# Patient Record
Sex: Male | Born: 1945 | Race: White | Hispanic: No | Marital: Married | Smoking: Former smoker
Health system: Southern US, Community
[De-identification: ages and names within clinical notes are randomized; demographics above are authoritative.]

## PROBLEM LIST (undated history)

## (undated) DIAGNOSIS — R29898 Other symptoms and signs involving the musculoskeletal system: Secondary | ICD-10-CM

## (undated) DIAGNOSIS — G20A1 Parkinson's disease without dyskinesia, without mention of fluctuations: Secondary | ICD-10-CM

## (undated) DIAGNOSIS — G20 Parkinson's disease: Secondary | ICD-10-CM

## (undated) HISTORY — PX: TOTAL HIP ARTHROPLASTY: SHX124

---

## 1993-04-02 HISTORY — PX: DEEP BRAIN STIMULATOR PLACEMENT: SHX608

## 2005-02-19 ENCOUNTER — Ambulatory Visit: Payer: Self-pay | Admitting: Cardiology

## 2006-12-25 ENCOUNTER — Ambulatory Visit (HOSPITAL_COMMUNITY): Admission: RE | Admit: 2006-12-25 | Discharge: 2006-12-25 | Payer: Self-pay | Admitting: Neurosurgery

## 2010-08-29 ENCOUNTER — Encounter (HOSPITAL_COMMUNITY)
Admission: RE | Admit: 2010-08-29 | Discharge: 2010-08-29 | Disposition: A | Discharge status: Home / Self Care | Payer: Medicare HMO | Source: Ambulatory Visit | Attending: Neurosurgery | Admitting: Neurosurgery

## 2010-08-29 LAB — CBC
HCT: 46.6 % (ref 39.0–52.0)
MCV: 88.3 fL (ref 78.0–100.0)
Platelets: 131 10*3/uL — ABNORMAL LOW (ref 150–400)
RBC: 5.28 MIL/uL (ref 4.22–5.81)
RDW: 13.5 % (ref 11.5–15.5)
WBC: 6.5 10*3/uL (ref 4.0–10.5)

## 2010-08-29 LAB — BASIC METABOLIC PANEL
BUN: 18 mg/dL (ref 6–23)
Creatinine, Ser: 0.95 mg/dL (ref 0.4–1.5)
GFR calc Af Amer: >60 mL/min (ref >60)
GFR calc non Af Amer: >60 mL/min (ref >60)

## 2010-08-29 LAB — SURGICAL PCR SCREEN
MRSA, PCR: NEGATIVE
Staphylococcus aureus: NEGATIVE

## 2010-08-29 LAB — PROTIME-INR: INR: 1.24 (ref 0.00–1.49)

## 2010-08-29 LAB — APTT: aPTT: 37 seconds (ref 24–37)

## 2010-08-31 ENCOUNTER — Ambulatory Visit (HOSPITAL_COMMUNITY)
Admission: RE | Admit: 2010-08-31 | Discharge: 2010-08-31 | Disposition: A | Discharge status: Home / Self Care | Payer: Medicare HMO | Source: Ambulatory Visit | Attending: Neurosurgery | Admitting: Neurosurgery

## 2010-08-31 DIAGNOSIS — Z462 Encounter for fitting and adjustment of other devices related to nervous system and special senses: Secondary | ICD-10-CM | POA: Insufficient documentation

## 2010-08-31 DIAGNOSIS — Z01812 Encounter for preprocedural laboratory examination: Secondary | ICD-10-CM | POA: Insufficient documentation

## 2010-08-31 DIAGNOSIS — G20 Parkinson's disease: Secondary | ICD-10-CM | POA: Insufficient documentation

## 2010-08-31 DIAGNOSIS — Z01810 Encounter for preprocedural cardiovascular examination: Secondary | ICD-10-CM | POA: Insufficient documentation

## 2010-08-31 DIAGNOSIS — E669 Obesity, unspecified: Secondary | ICD-10-CM | POA: Insufficient documentation

## 2010-08-31 DIAGNOSIS — G20A1 Parkinson's disease without dyskinesia, without mention of fluctuations: Secondary | ICD-10-CM | POA: Insufficient documentation

## 2010-09-05 NOTE — Op Note (Signed)
  NAMEJOSHIA, Johnny Wells                 ACCOUNT NO.:  [id number]  MEDICAL RECORD NO.:  [id number]           PATIENT TYPE:  O  LOCATION:  SDSC                         FACILITY:  [facility]  PHYSICIAN:  Danae Orleans. Venetia Maxon, M.D.  DATE OF BIRTH:  07-30-1945  DATE OF PROCEDURE:  08/31/2010 DATE OF DISCHARGE:                              OPERATIVE REPORT   PREOPERATIVE DIAGNOSIS:  Depleted implantable pulse generator with deep brain stimulator for Parkinson disease.  POSTOPERATIVE DIAGNOSIS:  Depleted implantable pulse generator with deep brain stimulator for Parkinson disease.  PROCEDURE:  Replacement of implantable pulse generator.  SURGEON:  Danae Orleans. Venetia Maxon, MD  ASSISTANT:  Georgiann Cocker, RN  ANESTHESIA:  Monitored sedation with local lidocaine.  COMPLICATIONS:  None.  ESTIMATED BLOOD LOSS:  Minimal.  DISPOSITION:  Recovery.  INDICATION:  Johnny Wells is a 65 year old man with Parkinson's who has previously had a deep brain stimulator placed and has a depleted implantable pulse generator.  He is having more trouble with dyskinesias and it was elected to place to revise the battery.  PROCEDURE:  Mr. Buresh was brought to the operating room.  He was placed in a supine position on the operating table.  Left upper chest and neck were then prepped and draped in usual sterile fashion.  After Betadine scrub paint, area of planned incision was infiltrated local lidocaine. Previous incision was opened.  The old battery was identified along the extensions, it was then brought out the pocket.  The extensions were disconnected and a pocket adapter was then placed.  A new DBS battery was then hooked up to the 6 lead extension placed within, all the wires were circularized behind the battery and was placed in the pocket.  The wound was then closed with 2-0 and 3-0 Vicryl sutures and sterile occlusive dressing was placed.  The patient was taken to recovery room having tolerated the procedure  well.     Danae Orleans. Venetia Maxon, M.D.     JDS/MEDQ  D:  08/31/2010  T:  09/01/2010  Job:  [id number]  Electronically Signed by Maeola Harman M.D. on 09/05/2010 11:14:31 AM

## 2010-12-08 ENCOUNTER — Inpatient Hospital Stay (HOSPITAL_COMMUNITY)
Admission: AD | Admit: 2010-12-08 | Discharge: 2010-12-09 | DRG: 042 | Disposition: A | Discharge status: Home / Self Care | Payer: Medicare HMO | Source: Ambulatory Visit | Attending: Neurosurgery | Admitting: Neurosurgery

## 2010-12-08 ENCOUNTER — Inpatient Hospital Stay: Admission: AD | Admit: 2010-12-08 | Payer: Medicare HMO | Source: Ambulatory Visit | Admitting: Neurosurgery

## 2010-12-08 DIAGNOSIS — E669 Obesity, unspecified: Secondary | ICD-10-CM | POA: Diagnosis present

## 2010-12-08 DIAGNOSIS — L8990 Pressure ulcer of unspecified site, unspecified stage: Secondary | ICD-10-CM | POA: Diagnosis present

## 2010-12-08 DIAGNOSIS — L89109 Pressure ulcer of unspecified part of back, unspecified stage: Secondary | ICD-10-CM | POA: Diagnosis present

## 2010-12-08 DIAGNOSIS — T85890A Other specified complication of nervous system prosthetic devices, implants and grafts, initial encounter: Principal | ICD-10-CM | POA: Diagnosis present

## 2010-12-08 DIAGNOSIS — Z86718 Personal history of other venous thrombosis and embolism: Secondary | ICD-10-CM

## 2010-12-08 DIAGNOSIS — L989 Disorder of the skin and subcutaneous tissue, unspecified: Secondary | ICD-10-CM | POA: Diagnosis present

## 2010-12-08 DIAGNOSIS — G20 Parkinson's disease: Secondary | ICD-10-CM | POA: Diagnosis present

## 2010-12-08 DIAGNOSIS — Y92009 Unspecified place in unspecified non-institutional (private) residence as the place of occurrence of the external cause: Secondary | ICD-10-CM

## 2010-12-08 DIAGNOSIS — G20A1 Parkinson's disease without dyskinesia, without mention of fluctuations: Secondary | ICD-10-CM | POA: Diagnosis present

## 2010-12-08 DIAGNOSIS — Y831 Surgical operation with implant of artificial internal device as the cause of abnormal reaction of the patient, or of later complication, without mention of misadventure at the time of the procedure: Secondary | ICD-10-CM | POA: Diagnosis present

## 2010-12-08 DIAGNOSIS — Z7901 Long term (current) use of anticoagulants: Secondary | ICD-10-CM

## 2010-12-08 LAB — CBC
MCHC: 36.4 g/dL — ABNORMAL HIGH (ref 30.0–36.0)
Platelets: 128 10*3/uL — ABNORMAL LOW (ref 150–400)
RDW: 13.7 % (ref 11.5–15.5)
WBC: 6.1 10*3/uL (ref 4.0–10.5)

## 2010-12-08 LAB — GRAM STAIN: Gram Stain: NONE SEEN

## 2010-12-08 LAB — DIFFERENTIAL
Basophils Absolute: 0 10*3/uL (ref 0.0–0.1)
Basophils Relative: 0 % (ref 0–1)
Eosinophils Absolute: 0.2 10*3/uL (ref 0.0–0.7)
Eosinophils Relative: 3 % (ref 0–5)
Monocytes Absolute: 0.6 10*3/uL (ref 0.1–1.0)

## 2010-12-08 LAB — BASIC METABOLIC PANEL
CO2: 28 mEq/L (ref 19–32)
Calcium: 9.8 mg/dL (ref 8.4–10.5)
Creatinine, Ser: 1.02 mg/dL (ref 0.50–1.35)
GFR calc non Af Amer: >60 mL/min (ref >60)

## 2010-12-08 LAB — SURGICAL PCR SCREEN: MRSA, PCR: NEGATIVE

## 2010-12-08 LAB — PROTIME-INR
INR: 2.38 — ABNORMAL HIGH (ref 0.00–1.49)
Prothrombin Time: 26.4 seconds — ABNORMAL HIGH (ref 11.6–15.2)

## 2010-12-11 LAB — WOUND CULTURE

## 2010-12-13 LAB — ANAEROBIC CULTURE: Gram Stain: NONE SEEN

## 2010-12-14 NOTE — H&P (Signed)
  NAMECASPAR, FAVILA                 ACCOUNT NO.:  [id number]  MEDICAL RECORD NO.:  [id number]  LOCATION:  3008                         FACILITY:  [facility]  PHYSICIAN:  Danae Orleans. Venetia Maxon, M.D.  DATE OF BIRTH:  [birth year]  DATE OF ADMISSION:  12/08/2010 DATE OF DISCHARGE:                             HISTORY & PHYSICAL   REASON FOR ADMISSION:  Skin erosion over implantable pulse generator for deep brain stimulator for Parkinson disease.  HISTORY OF PRESENT ILLNESS:  Johnny Wells is a 65 year old male, admitted today for removal implantable pulse generator for deep brain stimulator due to erosion through the skin.  He has been afebrile without complaints of pain.  He has a history of Parkinson and takes oral medications.  He is awaiting DBS programming.  He has a history of deep venous thrombosis for which he has been on Coumadin.  He has no known drug allergies.  He is 6 feet 3 inches tall, 290 pounds.  Does not use tobacco or alcohol.  No history of drug abuse.  PHYSICAL EXAMINATION:  HEENT:  The patient is normocephalic, atraumatic. Pupils are equal, round, and reactive to light.  Extraocular movements are intact.  Oropharynx is benign.  Usual is midline. NECK:  No tracheal deviation.  JVD, bruits auscultated.  He has good range of motion of his neck.  He has significant dyskinesias related to his Parkinson.  Deep brain stimulator extensions were palpable in left side of his neck which is nontender. RESPIRATORY:  Normal effort.  Clear to auscultation in all lobes. HEART:  Regular rhythm without murmurs.  No peripheral edema. ABDOMEN:  Obese, nontender.  Bowel sounds are active x4. NEUROLOGICAL:  Unable to ambulate any distances due to dyskinesia secondary to Parkinson's. SKIN:  He has a small sacral decubitus for which he is receiving daily dressing changes.  CURRENT MEDICATIONS: 1. Carbidopa 25/100 one 4 times daily. 2. Comtan 200 mg 4 times daily. 3. Detrol 4 mg daily. 4.  Paxil 20 mg daily. 5. Coumadin 7.5 mg daily afternoon, last dose on December 07, 2010.  SURGICAL HISTORY:  History of deep brain stimulation in January 2005 and DBS implantable pulse generator revision in May 2012.  VITALS:  Blood pressure 104/70, heart rate of 80 and regular, respiratory rate 18.  He is afebrile.  PLAN:  To admit the patient to 3000 for removal of deep brain stimulator, implantable pulse generator.  Told Coumadin to continue with home medications otherwise.  He is to start with IV fluids and the preoperative laboratories and he is made n.p.o. for pending surgery.     Danae Orleans. Venetia Maxon, M.D.     JDS/MEDQ  D:  12/08/2010  T:  12/09/2010  Job:  [id number]  Electronically Signed by Maeola Harman M.D. on 12/14/2010 02:48:55 PM

## 2010-12-14 NOTE — Op Note (Signed)
  NAMERUFORD, DUDZINSKI                 ACCOUNT NO.:  [id number]  MEDICAL RECORD NO.:  [id number]  LOCATION:  3008                         FACILITY:  [facility]  PHYSICIAN:  Danae Orleans. Venetia Maxon, M.D.  DATE OF BIRTH:  04-06-1945  DATE OF PROCEDURE:  12/08/2010 DATE OF DISCHARGE:                              OPERATIVE REPORT   PREOPERATIVE DIAGNOSIS:  Eroded implantable pulse generator site.  POSTOPERATIVE DIAGNOSIS:  Eroded implantable pulse generator site.  PROCEDURE:  Removal of implantable pulse generator and electrode extension.  SURGEON:  Danae Orleans. Venetia Maxon, MD  ANESTHESIA:  General with LMA local anesthetic.  COMPLICATIONS:  None.  DISPOSITION:  To Recovery.  INDICATIONS:  Melvyn Hommes is a 65 year old man who had an implantable pulse generator placed into his left upper chest or in subcutaneous region for connected to electrodes for the treatment of Parkinson disease.  The patient had a superficial infection nearly postoperatively and then developed skin breakdown with exposure of the IPG, this required explantation of the device.  We have elected to remove this as well as to cut the electrode extensions distance from the IPG site.  PROCEDURE:  Mr. Italiano was brought to the operating room.  Following the induction of anesthesia and placement of the LMA, his left upper chest and postauricular region were then prepped and draped in usual sterile fashion.  Prior to doing so, cultures were taken from the exposed battery and pocket.  The area of planned incision in the postauricular region was then infiltrated with local lidocaine.  Incision was made, carried to the extensions which were exposed and then cut.  The battery was then removed from the chest site and the pocket was extensively irrigated.  The postauricular region was then closed with 2-0 Vicryl stitches and staples and the chest site was closed in a similar fashion. The sterile occlusive dressings were placed.  The patient was  taken to recovery and tolerated the procedure well.     Danae Orleans. Venetia Maxon, M.D.    JDS/MEDQ  D:  12/08/2010  T:  12/09/2010  Job:  [id number]  Electronically Signed by Maeola Harman M.D. on 12/14/2010 02:48:57 PM

## 2011-01-11 LAB — CBC
RBC: 5.11
WBC: 6.1

## 2011-01-11 LAB — BASIC METABOLIC PANEL
Calcium: 10.7 — ABNORMAL HIGH
Creatinine, Ser: 1.1
GFR calc Af Amer: >60

## 2011-01-11 LAB — APTT: aPTT: 35

## 2011-01-11 LAB — PROTIME-INR
INR: 1.3
Prothrombin Time: 16.1 — ABNORMAL HIGH

## 2014-05-19 ENCOUNTER — Emergency Department (HOSPITAL_COMMUNITY): Payer: Medicare Other

## 2014-05-19 ENCOUNTER — Encounter (HOSPITAL_COMMUNITY): Payer: Self-pay | Admitting: Emergency Medicine

## 2014-05-19 ENCOUNTER — Inpatient Hospital Stay (HOSPITAL_COMMUNITY)
Admission: EM | Admit: 2014-05-19 | Discharge: 2014-05-24 | DRG: 871 | Disposition: A | Discharge status: Home Health | Payer: Medicare Other | Attending: Family Medicine | Admitting: Family Medicine

## 2014-05-19 DIAGNOSIS — R4182 Altered mental status, unspecified: Secondary | ICD-10-CM

## 2014-05-19 DIAGNOSIS — Z87891 Personal history of nicotine dependence: Secondary | ICD-10-CM | POA: Diagnosis not present

## 2014-05-19 DIAGNOSIS — D696 Thrombocytopenia, unspecified: Secondary | ICD-10-CM | POA: Diagnosis present

## 2014-05-19 DIAGNOSIS — Z96641 Presence of right artificial hip joint: Secondary | ICD-10-CM | POA: Diagnosis present

## 2014-05-19 DIAGNOSIS — N390 Urinary tract infection, site not specified: Secondary | ICD-10-CM | POA: Diagnosis present

## 2014-05-19 DIAGNOSIS — Z7401 Bed confinement status: Secondary | ICD-10-CM

## 2014-05-19 DIAGNOSIS — J189 Pneumonia, unspecified organism: Secondary | ICD-10-CM

## 2014-05-19 DIAGNOSIS — Z8249 Family history of ischemic heart disease and other diseases of the circulatory system: Secondary | ICD-10-CM

## 2014-05-19 DIAGNOSIS — A419 Sepsis, unspecified organism: Principal | ICD-10-CM

## 2014-05-19 DIAGNOSIS — F0280 Dementia in other diseases classified elsewhere without behavioral disturbance: Secondary | ICD-10-CM | POA: Diagnosis present

## 2014-05-19 DIAGNOSIS — I639 Cerebral infarction, unspecified: Secondary | ICD-10-CM | POA: Diagnosis present

## 2014-05-19 DIAGNOSIS — G20 Parkinson's disease: Secondary | ICD-10-CM

## 2014-05-19 DIAGNOSIS — Z833 Family history of diabetes mellitus: Secondary | ICD-10-CM

## 2014-05-19 DIAGNOSIS — N12 Tubulo-interstitial nephritis, not specified as acute or chronic: Secondary | ICD-10-CM | POA: Diagnosis present

## 2014-05-19 DIAGNOSIS — G9341 Metabolic encephalopathy: Secondary | ICD-10-CM | POA: Diagnosis present

## 2014-05-19 DIAGNOSIS — G9340 Encephalopathy, unspecified: Secondary | ICD-10-CM | POA: Insufficient documentation

## 2014-05-19 DIAGNOSIS — Z823 Family history of stroke: Secondary | ICD-10-CM | POA: Diagnosis not present

## 2014-05-19 DIAGNOSIS — G3183 Dementia with Lewy bodies: Secondary | ICD-10-CM | POA: Diagnosis present

## 2014-05-19 DIAGNOSIS — R531 Weakness: Secondary | ICD-10-CM

## 2014-05-19 DIAGNOSIS — R0902 Hypoxemia: Secondary | ICD-10-CM | POA: Diagnosis present

## 2014-05-19 DIAGNOSIS — R404 Transient alteration of awareness: Secondary | ICD-10-CM

## 2014-05-19 HISTORY — DX: Parkinson's disease: G20

## 2014-05-19 HISTORY — DX: Other symptoms and signs involving the musculoskeletal system: R29.898

## 2014-05-19 HISTORY — DX: Parkinson's disease without dyskinesia, without mention of fluctuations: G20.A1

## 2014-05-19 LAB — URINALYSIS, ROUTINE W REFLEX MICROSCOPIC
Bilirubin Urine: NEGATIVE
Glucose, UA: NEGATIVE mg/dL
Nitrite: POSITIVE — AB
PH: 8.5 — AB (ref 5.0–8.0)
Protein, ur: 30 mg/dL — AB
Specific Gravity, Urine: 1.015 (ref 1.005–1.030)
Urobilinogen, UA: 4 mg/dL — ABNORMAL HIGH (ref 0.0–1.0)

## 2014-05-19 LAB — BLOOD GAS, ARTERIAL
Acid-Base Excess: 1.3 mmol/L (ref 0.0–2.0)
BICARBONATE: 25.6 meq/L — AB (ref 20.0–24.0)
Drawn by: 234301
FIO2: 21 %
O2 Saturation: 91.8 %
PATIENT TEMPERATURE: 37
PO2 ART: 62.7 mmHg — AB (ref 80.0–100.0)
TCO2: 21.9 mmol/L (ref 0–100)
pCO2 arterial: 41.9 mmHg (ref 35.0–45.0)
pH, Arterial: 7.402 (ref 7.350–7.450)

## 2014-05-19 LAB — CBC
HEMATOCRIT: 47 % (ref 39.0–52.0)
Hemoglobin: 16.5 g/dL (ref 13.0–17.0)
MCH: 32.9 pg (ref 26.0–34.0)
MCHC: 35.1 g/dL (ref 30.0–36.0)
MCV: 93.6 fL (ref 78.0–100.0)
Platelets: 141 10*3/uL — ABNORMAL LOW (ref 150–400)
RBC: 5.02 MIL/uL (ref 4.22–5.81)
RDW: 13.4 % (ref 11.5–15.5)
WBC: 7.7 10*3/uL (ref 4.0–10.5)

## 2014-05-19 LAB — URINE MICROSCOPIC-ADD ON

## 2014-05-19 LAB — BASIC METABOLIC PANEL
Anion gap: 6 (ref 5–15)
BUN: 20 mg/dL (ref 6–23)
CHLORIDE: 104 mmol/L (ref 96–112)
CO2: 28 mmol/L (ref 19–32)
Calcium: 9.9 mg/dL (ref 8.4–10.5)
Creatinine, Ser: 1.18 mg/dL (ref 0.50–1.35)
GFR calc non Af Amer: 62 mL/min — ABNORMAL LOW (ref >90)
GFR, EST AFRICAN AMERICAN: 71 mL/min — AB (ref >90)
GLUCOSE: 95 mg/dL (ref 70–99)
POTASSIUM: 3.9 mmol/L (ref 3.5–5.1)
Sodium: 138 mmol/L (ref 135–145)

## 2014-05-19 LAB — PROTIME-INR
INR: 1.24 (ref 0.00–1.49)
Prothrombin Time: 15.7 seconds — ABNORMAL HIGH (ref 11.6–15.2)

## 2014-05-19 LAB — I-STAT CG4 LACTIC ACID, ED: Lactic Acid, Venous: 1.38 mmol/L (ref 0.5–2.0)

## 2014-05-19 LAB — APTT: APTT: 40 s — AB (ref 24–37)

## 2014-05-19 LAB — PROCALCITONIN: Procalcitonin: <0.1 ng/mL

## 2014-05-19 LAB — MRSA PCR SCREENING: MRSA by PCR: NEGATIVE

## 2014-05-19 LAB — CBG MONITORING, ED: GLUCOSE-CAPILLARY: 95 mg/dL (ref 70–99)

## 2014-05-19 MED ORDER — LEVOFLOXACIN IN D5W 750 MG/150ML IV SOLN
750.0000 mg | INTRAVENOUS | Status: DC
  Administered 2014-05-19: 750 mg via INTRAVENOUS
  Filled 2014-05-19: qty 150

## 2014-05-19 MED ORDER — VANCOMYCIN HCL IN DEXTROSE 1-5 GM/200ML-% IV SOLN
1000.0000 mg | Freq: Three times a day (TID) | INTRAVENOUS | Status: DC
  Filled 2014-05-19 (×3): qty 200

## 2014-05-19 MED ORDER — PIPERACILLIN-TAZOBACTAM 3.375 G IVPB
3.3750 g | Freq: Three times a day (TID) | INTRAVENOUS | Status: DC
  Filled 2014-05-19 (×3): qty 50

## 2014-05-19 MED ORDER — SODIUM CHLORIDE 0.9 % IV SOLN: INTRAVENOUS | Status: DC

## 2014-05-19 MED ORDER — MORPHINE SULFATE 2 MG/ML IJ SOLN: 2.0000 mg | INTRAMUSCULAR | Status: DC | PRN

## 2014-05-19 MED ORDER — SODIUM CHLORIDE 0.9 % IV SOLN
INTRAVENOUS | Status: DC
  Administered 2014-05-19: 20:00:00 via INTRAVENOUS

## 2014-05-19 MED ORDER — ACETAMINOPHEN 325 MG PO TABS: 650.0000 mg | ORAL_TABLET | Freq: Four times a day (QID) | ORAL | Status: DC | PRN

## 2014-05-19 MED ORDER — ONDANSETRON HCL 4 MG PO TABS: 4.0000 mg | ORAL_TABLET | Freq: Four times a day (QID) | ORAL | Status: DC | PRN

## 2014-05-19 MED ORDER — ENTACAPONE 200 MG PO TABS
200.0000 mg | ORAL_TABLET | Freq: Four times a day (QID) | ORAL | Status: DC
Start: 2014-05-19 — End: 2014-05-24
  Administered 2014-05-20 – 2014-05-24 (×18): 200 mg via ORAL
  Filled 2014-05-19 (×28): qty 1

## 2014-05-19 MED ORDER — SODIUM CHLORIDE 0.9 % IJ SOLN
3.0000 mL | Freq: Two times a day (BID) | INTRAMUSCULAR | Status: DC
  Administered 2014-05-19 – 2014-05-23 (×6): 3 mL via INTRAVENOUS

## 2014-05-19 MED ORDER — ACETAMINOPHEN 650 MG RE SUPP: 650.0000 mg | Freq: Four times a day (QID) | RECTAL | Status: DC | PRN

## 2014-05-19 MED ORDER — ONDANSETRON HCL 4 MG/2ML IJ SOLN: 4.0000 mg | Freq: Four times a day (QID) | INTRAMUSCULAR | Status: DC | PRN

## 2014-05-19 MED ORDER — CARBIDOPA-LEVODOPA 25-100 MG PO TABS
2.0000 | ORAL_TABLET | Freq: Four times a day (QID) | ORAL | Status: DC
  Administered 2014-05-20 – 2014-05-24 (×18): 2 via ORAL
  Filled 2014-05-19 (×26): qty 2

## 2014-05-19 MED ORDER — STROKE: EARLY STAGES OF RECOVERY BOOK
Freq: Once | Status: DC
  Filled 2014-05-19: qty 1

## 2014-05-19 MED ORDER — RIVAROXABAN 20 MG PO TABS
20.0000 mg | ORAL_TABLET | Freq: Every day | ORAL | Status: DC
  Administered 2014-05-20 – 2014-05-24 (×5): 20 mg via ORAL
  Filled 2014-05-19 (×7): qty 1

## 2014-05-19 MED ORDER — VANCOMYCIN HCL IN DEXTROSE 1-5 GM/200ML-% IV SOLN
1000.0000 mg | Freq: Once | INTRAVENOUS | Status: AC
  Administered 2014-05-19: 1000 mg via INTRAVENOUS
  Filled 2014-05-19: qty 200

## 2014-05-19 MED ORDER — SODIUM CHLORIDE 0.9 % IV BOLUS (SEPSIS)
250.0000 mL | Freq: Once | INTRAVENOUS | Status: AC
  Administered 2014-05-19: 250 mL via INTRAVENOUS

## 2014-05-19 MED ORDER — PIPERACILLIN-TAZOBACTAM 3.375 G IVPB 30 MIN
3.3750 g | Freq: Once | INTRAVENOUS | Status: AC
  Administered 2014-05-19: 3.375 g via INTRAVENOUS
  Filled 2014-05-19: qty 50

## 2014-05-19 NOTE — Progress Notes (Signed)
Pharmacy Note:  Initial antibiotics for Levaquin  CrCl cannot be calculated (Unknown ideal weight.).   No Known Allergies  Filed Vitals:   05/19/14 1700  BP: 141/80  Pulse: 94  Temp:   Resp: 24    Anti-infectives    Start     Dose/Rate Route Frequency Ordered Stop   05/19/14 2200  piperacillin-tazobactam (ZOSYN) IVPB 3.375 g  Status:  Discontinued     3.375 g 12.5 mL/hr over 240 Minutes Intravenous Every 8 hours 05/19/14 1914 05/19/14 1915   05/19/14 2200  vancomycin (VANCOCIN) IVPB 1000 mg/200 mL premix  Status:  Discontinued     1,000 mg 200 mL/hr over 60 Minutes Intravenous Every 8 hours 05/19/14 1914 05/19/14 1915   05/19/14 2000  levofloxacin (LEVAQUIN) IVPB 750 mg     750 mg 100 mL/hr over 90 Minutes Intravenous Every 24 hours 05/19/14 1911     05/19/14 1400  piperacillin-tazobactam (ZOSYN) IVPB 3.375 g     3.375 g 100 mL/hr over 30 Minutes Intravenous  Once 05/19/14 1349 05/19/14 1510   05/19/14 1400  vancomycin (VANCOCIN) IVPB 1000 mg/200 mL premix     1,000 mg 200 mL/hr over 60 Minutes Intravenous  Once 05/19/14 1349 05/19/14 1633      Plan: Initial doses of Levaquin 750mg  IV q24hrs F/U admission orders for further dosing if therapy continued.  Wayland DenisHall, Shantrell Placzek A, [facility] CenterRPH 05/19/2014 7:16 PM

## 2014-05-19 NOTE — H&P (Signed)
[facility] History and Physical  Kerri PerchesDarryl L Cicalese WGN:[id number]:[id number] DOB: [birth year] DOA: 05/19/2014  Referring physician: Dr Deretha EmoryZackowski - [facility] PCP: Kirstie PeriSHAH,ASHISH, MD   Chief Complaint: Weakness  HPI: Johnny Wells is a 69 y.o. male  Level 5 Caveat - Pt presenting with altered mental status.   History provided by Pts spouse  Weakness. Started 2 days ago. Pt slept most of the lat 2 days. Symptoms are constant and getting worse. Very little to eat or drink during this time. Last night became progressively more unresponsive. L hand and arm weakness, and LE weakness at baseline.   Family denies any other complaints over the last few days including CP, SOB, palpitations, abd pain, dysuria, frequency, constipation, fevers, nausea, vomiting, diarrhea, rash, constipation, joint swelling, trauma/falls, HA, syncope, dizziness.   Review of Systems:  Pt unable to give further information due to being altered    Past Medical History  Diagnosis Date  . Parkinson disease   . Left arm weakness    Past Surgical History  Procedure Laterality Date  . Total hip arthroplasty Right   . Deep brain stimulator placement  1995   Social History:  reports that he quit smoking about 6 weeks ago. He does not have any smokeless tobacco history on file. His alcohol and drug histories are not on file.  No Known Allergies  Family History  Problem Relation Age of Onset  . Stroke Mother   . Diabetes Mother   . Heart attack Brother      Prior to Admission medications   Medication Sig Start Date End Date Taking? Authorizing Provider  carbidopa-levodopa (SINEMET IR) 25-100 MG per tablet Take 2 tablets by mouth 4 (four) times daily.   Yes Historical Provider, MD  entacapone (COMTAN) 200 MG tablet Take 200 mg by mouth 4 (four) times daily.   Yes Historical Provider, MD  rivaroxaban (XARELTO) 20 MG TABS tablet Take 20 mg by mouth daily with supper.   Yes Historical Provider, MD   Physical Exam: Filed Vitals:     05/19/14 1330 05/19/14 1345 05/19/14 1438 05/19/14 1600  BP: 125/78   137/89  Pulse: 36     Temp:      TempSrc:      Resp: 25 21  21   Weight:   124.739 kg (275 lb)   SpO2: 95% 95%  97%    Wt Readings from Last 3 Encounters:  05/19/14 124.739 kg (275 lb)    General:  Sleeping but arousable Eyes:  PERRL, normal lids, irises & conjunctiva ENT: Dry mucus membranes Neck:  no LAD, masses or thyromegaly Cardiovascular: RRR, no m/r/g. No LE edema. Telemetry:  SR, no arrhythmias  Respiratory: diffuse crackles in lung bases, mild increased effort.  Abdomen:  soft, ntnd Skin:  no rash or induration seen on limited exam Musculoskeletal:  grossly normal tone BUE/BLE Psychiatric: Follows commands and attempts to answer questions.  Neurologic: Limited exam due to AMS. Grip strength 4/5 L and 5/5 R, foot movement symmetrical, unable to lift legs bilat.           Labs on Admission:  Basic Metabolic Panel:  Recent Labs Lab 05/19/14 1123  NA 138  K 3.9  CL 104  CO2 28  GLUCOSE 95  BUN 20  CREATININE 1.18  CALCIUM 9.9   Liver Function Tests: No results for input(s): AST, ALT, ALKPHOS, BILITOT, PROT, ALBUMIN in the last 168 hours. No results for input(s): LIPASE, AMYLASE in the last 168 hours. No results for  input(s): AMMONIA in the last 168 hours. CBC:  Recent Labs Lab 05/19/14 1123  WBC 7.7  HGB 16.5  HCT 47.0  MCV 93.6  PLT 141*   Cardiac Enzymes: No results for input(s): CKTOTAL, CKMB, CKMBINDEX, TROPONINI in the last 168 hours.  BNP (last 3 results) No results for input(s): BNP in the last 8760 hours.  ProBNP (last 3 results) No results for input(s): PROBNP in the last 8760 hours.  CBG:  Recent Labs Lab 05/19/14 1232  GLUCAP 95    Radiological Exams on Admission: Ct Head Wo Contrast  05/19/2014   CLINICAL DATA:  Altered mental status, history of deep brain stimulator for Parkinson's disease  EXAM: CT HEAD WITHOUT CONTRAST  TECHNIQUE: Contiguous axial  images were obtained from the base of the skull through the vertex without intravenous contrast.  COMPARISON:  02/18/2005  FINDINGS: There is no evidence of mass effect, midline shift or extra-axial fluid collections. There is no evidence of a space-occupying lesion or intracranial hemorrhage. Mild low attenuation in the right medial temporal lobe temporal lobe concerning for a subacute infarct. There are bilateral deep brain stimulator wires terminating in the thalamus. Mild bilateral periventricular white matter low attenuation likely secondary to microangiopathy.  The ventricles and sulci are appropriate for the patient's age. The basal cisterns are patent.  Visualized portions of the orbits are unremarkable. Mild bilateral maxillary sinus mucosal thickening. Mastoid sinuses are clear.  The osseous structures are unremarkable.  IMPRESSION: 1. Mild low attenuation in the right medial temporal lobe temporal lobe concerning for a subacute infarct.   Electronically Signed   By: Elige Ko   On: 05/19/2014 14:26   Dg Chest Portable 1 View  05/19/2014   CLINICAL DATA:  Cough and congestion ; lethargy  EXAM: PORTABLE CHEST - 1 VIEW  COMPARISON:  December 25, 2006  FINDINGS: There is left base infiltrate. Lungs elsewhere clear. Heart is upper normal in size with pulmonary vascularity within normal limits. No adenopathy. No bone lesions.  IMPRESSION: Focal infiltrate left base. Elsewhere lungs clear. Heart upper normal in size.   Electronically Signed   By: Bretta Bang III M.D.   On: 05/19/2014 13:03    RUE:[id number]  Assessment/Plan Principal Problem:   Sepsis Active Problems:   CAP (community acquired pneumonia)   Altered mental status   Generalized weakness   Parkinsonism   Stroke   Pyelonephritis  Sepsis: 2 likely sources - CAP and UTI/Pyelo. Lacitc acid 1.38. Altered. BCX drawn and started on Vanc/Zos. - Admit to stepdown - 2L NS bolus, NS 138ml/hr - f/u BCX - UCX - Stop Vanc Zos and  start Levaquin - NPO except sips w/ meds - procalcitonin - ABG - EKG  Stroke: subacute R medial temporal lobe stroke noted on CT. Cannot perform MRI due to brain stimulator. Pt w/ baseline LUE weakness and bilat LE weakness. No new deficits noted per family. Discussed case w/ Dr. Gerilyn Pilgrim who agrees with workup and will consult if pt shows signs of acute insult.  - PT/OT - Swallow eval - Consult Neuro - Neuro checks - Echo  Parkinsons: advanced. Brain stimulator in place since mid 90s.  - continue Sinemet and Comtan  Brain Stimulator - continue Xarelto  Code Status: FULL DVT Prophylaxis: Xarelto Family Communication: Wife adn friend Disposition Plan: pending improvement   MERRELL, DAVID J, MD Family Medicine [facility] [email or web address] Password [email or web address]

## 2014-05-19 NOTE — ED Notes (Signed)
EDP at bedside  

## 2014-05-19 NOTE — Progress Notes (Signed)
Spoke with wife on the phone. She states that pt is bedridden and she and her niece does most everything for pt. She states that he usually does swallow pills with lots of prompting. Social work consult placed because she states she really wants to take care of her husband at home but she could use some help. She states it is hard for pt to communicate because of his parkinsons. Mrs. Blaney answered pt's admission questions as best as she could. 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

## 2014-05-19 NOTE — Progress Notes (Signed)
Pharmacy Note:  Vancomycin and Zosyn  Initial antibiotics for Vancomycin 1000mg  and Zosyn 3.375gm ordered by EDP for sepsis have been given.  CrCl cannot be calculated (Unknown ideal weight.).   No Known Allergies  Filed Vitals:   05/19/14 1345  BP:   Pulse:   Temp:   Resp: 21    Anti-infectives    Start     Dose/Rate Route Frequency Ordered Stop   05/19/14 1400  piperacillin-tazobactam (ZOSYN) IVPB 3.375 g     3.375 g 100 mL/hr over 30 Minutes Intravenous  Once 05/19/14 1349 05/19/14 1510   05/19/14 1400  vancomycin (VANCOCIN) IVPB 1000 mg/200 mL premix     1,000 mg 200 mL/hr over 60 Minutes Intravenous  Once 05/19/14 1349        Plan: Initial doses of Vancomycin and Zosyn X 1 given. Additional orders for maintenance doses have been signed and held pending admission. F/U admission orders for further orders or changes if therapy continued.  Wayland DenisHall, Gloriana Piltz A, [facility] WorthRPH 05/19/2014 3:46 PM

## 2014-05-19 NOTE — ED Notes (Signed)
EDP in with pt 

## 2014-05-19 NOTE — ED Provider Notes (Signed)
CSN: [id number]     Arrival date & time 05/19/14  1049 History  This chart was scribed for Vanetta MuldersScott Keana Dueitt, MD by Abel PrestoKara Demonbreun, ED Scribe. This patient was seen in room APA18/APA18 and the patient's care was started at 1:38 PM.    Chief Complaint  Patient presents with  . Weakness   LEVEL 5 CAVEAT- AMS Patient is a 69 y.o. male presenting with weakness. The history is provided by the spouse. The history is limited by the condition of the patient. No language interpreter was used.  Weakness   HPI Comments: Johnny Wells is a 69 y.o. male brought in by ambulance, with h/o deep brain stimulator placement and Parkinson's disease who presents to the Emergency Department complaining of weakness with onset today.  Wife notes pt has seemed lethargic today and not talking as much. She states he has slept through most of the last 2 days. She notes he has not been responding to her raising her voice as well. She notes cough, congestion,  and left hand weakness at baseline. Pt breathing heavily with mouth open in exam room. Wife denies diarrhea and vomiting. Pt's PCP is Dr. Sherryll BurgerShah and Dr. Linward NatalGhandhi.  Past Medical History  Diagnosis Date  . Parkinson disease   . Left arm weakness    Past Surgical History  Procedure Laterality Date  . Total hip arthroplasty Right   . Deep brain stimulator placement  1995   Family History  Problem Relation Age of Onset  . Stroke Mother   . Diabetes Mother   . Heart attack Brother    History  Substance Use Topics  . Smoking status: Former Smoker    Quit date: 04/02/2014  . Smokeless tobacco: Not on file  . Alcohol Use: Not on file    Review of Systems  Unable to perform ROS: Mental status change  Neurological: Positive for weakness.      Allergies  Review of patient's allergies indicates no known allergies.  Home Medications   Prior to Admission medications   Medication Sig Start Date End Date Taking? Authorizing Provider  carbidopa-levodopa  (SINEMET IR) 25-100 MG per tablet Take 2 tablets by mouth 4 (four) times daily.   Yes Historical Provider, MD  entacapone (COMTAN) 200 MG tablet Take 200 mg by mouth 4 (four) times daily.   Yes Historical Provider, MD  rivaroxaban (XARELTO) 20 MG TABS tablet Take 20 mg by mouth daily with supper.   Yes Historical Provider, MD   BP 137/89 mmHg  Pulse 36  Temp(Src) 100.4 F (38 C) (Rectal)  Resp 21  Wt 275 lb (124.739 kg)  SpO2 97% Physical Exam  Constitutional: He is oriented to person, place, and time. He appears well-developed and well-nourished.  HENT:  Head: Normocephalic.  Eyes: Conjunctivae are normal.  Neck: Normal range of motion. Neck supple.  Cardiovascular: Normal rate.  An irregular rhythm present.  No murmur heard. Pulmonary/Chest: Effort normal. He has rhonchi (bilaterally). He has rales.  Abdominal: Soft. Bowel sounds are normal.  Musculoskeletal: Normal range of motion. He exhibits no edema.  Neurological: He is alert and oriented to person, place, and time. No cranial nerve deficit. He exhibits normal muscle tone. Coordination normal.  Skin: Skin is warm and dry.  Psychiatric: He has a normal mood and affect. His behavior is normal.  Nursing note and vitals reviewed.   ED Course  Procedures (including critical care time) DIAGNOSTIC STUDIES: Oxygen Saturation is 92% on room air, adequate by my interpretation.  COORDINATION OF CARE: 1:46 PM Discussed treatment plan with patient at beside, the patient agrees with the plan and has no further questions at this time.   Labs Review Labs Reviewed  BASIC METABOLIC PANEL - Abnormal; Notable for the following:    GFR calc non Af Amer 62 (*)    GFR calc Af Amer 71 (*)    All other components within normal limits  CBC - Abnormal; Notable for the following:    Platelets 141 (*)    All other components within normal limits  URINALYSIS, ROUTINE W REFLEX MICROSCOPIC - Abnormal; Notable for the following:    Color, Urine  AMBER (*)    APPearance HAZY (*)    pH 8.5 (*)    Hgb urine dipstick LARGE (*)    Ketones, ur TRACE (*)    Protein, ur 30 (*)    Urobilinogen, UA 4.0 (*)    Nitrite POSITIVE (*)    Leukocytes, UA MODERATE (*)    All other components within normal limits  URINE MICROSCOPIC-ADD ON - Abnormal; Notable for the following:    Bacteria, UA MANY (*)    All other components within normal limits  CULTURE, BLOOD (ROUTINE X 2)  URINE CULTURE  URINE CULTURE  CULTURE, BLOOD (ROUTINE X 2)  CBG MONITORING, ED  I-STAT CG4 LACTIC ACID, ED  I-STAT CG4 LACTIC ACID, ED   Results for orders placed or performed during the hospital encounter of 05/19/14  Culture, blood (routine x 2)  Result Value Ref Range   Specimen Description BLOOD LEFT ARM    Special Requests      BOTTLES DRAWN AEROBIC AND ANAEROBIC AEB=9CC ANA=6CC   Culture PENDING    Report Status PENDING   Basic metabolic panel  (at [facility])  Result Value Ref Range   Sodium 138 135 - 145 mmol/L   Potassium 3.9 3.5 - 5.1 mmol/L   Chloride 104 96 - 112 mmol/L   CO2 28 19 - 32 mmol/L   Glucose, Bld 95 70 - 99 mg/dL   BUN 20 6 - 23 mg/dL   Creatinine, Ser 1.61 0.50 - 1.35 mg/dL   Calcium 9.9 8.4 - 09.6 mg/dL   GFR calc non Af Amer 62 (L) >90 mL/min   GFR calc Af Amer 71 (L) >90 mL/min   Anion gap 6 5 - 15  CBC  (at [facility])  Result Value Ref Range   WBC 7.7 4.0 - 10.5 K/uL   RBC 5.02 4.22 - 5.81 MIL/uL   Hemoglobin 16.5 13.0 - 17.0 g/dL   HCT 04.5 40.9 - 81.1 %   MCV 93.6 78.0 - 100.0 fL   MCH 32.9 26.0 - 34.0 pg   MCHC 35.1 30.0 - 36.0 g/dL   RDW 91.4 78.2 - 95.6 %   Platelets 141 (L) 150 - 400 K/uL  Urinalysis, Routine w reflex microscopic  Result Value Ref Range   Color, Urine AMBER (A) YELLOW   APPearance HAZY (A) CLEAR   Specific Gravity, Urine 1.015 1.005 - 1.030   pH 8.5 (H) 5.0 - 8.0   Glucose, UA NEGATIVE NEGATIVE mg/dL   Hgb urine dipstick LARGE (A) NEGATIVE   Bilirubin Urine NEGATIVE NEGATIVE    Ketones, ur TRACE (A) NEGATIVE mg/dL   Protein, ur 30 (A) NEGATIVE mg/dL   Urobilinogen, UA 4.0 (H) 0.0 - 1.0 mg/dL   Nitrite POSITIVE (A) NEGATIVE   Leukocytes, UA MODERATE (A) NEGATIVE  Urine microscopic-add on  Result  Value Ref Range   WBC, UA 7-10 <3 WBC/hpf   RBC / HPF 7-10 <3 RBC/hpf   Bacteria, UA MANY (A) RARE  CBG, ED  Result Value Ref Range   Glucose-Capillary 95 70 - 99 mg/dL  I-Stat CG4 Lactic Acid, ED  Result Value Ref Range   Lactic Acid, Venous 1.38 0.5 - 2.0 mmol/L     Imaging Review Ct Head Wo Contrast  05/19/2014   CLINICAL DATA:  Altered mental status, history of deep brain stimulator for Parkinson's disease  EXAM: CT HEAD WITHOUT CONTRAST  TECHNIQUE: Contiguous axial images were obtained from the base of the skull through the vertex without intravenous contrast.  COMPARISON:  02/18/2005  FINDINGS: There is no evidence of mass effect, midline shift or extra-axial fluid collections. There is no evidence of a space-occupying lesion or intracranial hemorrhage. Mild low attenuation in the right medial temporal lobe temporal lobe concerning for a subacute infarct. There are bilateral deep brain stimulator wires terminating in the thalamus. Mild bilateral periventricular white matter low attenuation likely secondary to microangiopathy.  The ventricles and sulci are appropriate for the patient's age. The basal cisterns are patent.  Visualized portions of the orbits are unremarkable. Mild bilateral maxillary sinus mucosal thickening. Mastoid sinuses are clear.  The osseous structures are unremarkable.  IMPRESSION: 1. Mild low attenuation in the right medial temporal lobe temporal lobe concerning for a subacute infarct.   Electronically Signed   By: Elige Ko   On: 05/19/2014 14:26   Dg Chest Portable 1 View  05/19/2014   CLINICAL DATA:  Cough and congestion ; lethargy  EXAM: PORTABLE CHEST - 1 VIEW  COMPARISON:  December 25, 2006  FINDINGS: There is left base infiltrate.  Lungs elsewhere clear. Heart is upper normal in size with pulmonary vascularity within normal limits. No adenopathy. No bone lesions.  IMPRESSION: Focal infiltrate left base. Elsewhere lungs clear. Heart upper normal in size.   Electronically Signed   By: Bretta Bang III M.D.   On: 05/19/2014 13:03     EKG Interpretation None      MDM   Final diagnoses:  Altered mental status  UTI (lower urinary tract infection)  CAP (community acquired pneumonia)  Cerebral infarction due to unspecified mechanism   Patient presents with fever. Not hypotensive. Lactic acid is less than 2. The patient has evidence of urinary tract infection and pneumonia, no recent hospitalizations lives at home so this would be consistent with a community-acquired pneumonia. As well CT of the brain shows a subacute infarct in the temporal lobe. Whether this is playing a role with his lethargy or not is not clear. Patient started on broad-spectrum antibiotics after cultures were done. Patient will be admitted to the stepdown unit. Neurology will be consult on by the admitting hospitalist. Patient is Xarelto.  I personally performed the services described in this documentation, which was scribed in my presence. The recorded information has been reviewed and is accurate.      Vanetta Mulders, MD 05/19/14 [phone]

## 2014-05-19 NOTE — ED Notes (Signed)
Per EMS, was told by wife that patient has been acting lethargic, not talking as much as usual. Per EMS was told by wife that patient has cough/congestion all the time from parkinson's.

## 2014-05-19 NOTE — Progress Notes (Signed)
Pharamcy called to verify medications given too close together. Pharmacy states it is fine to give medications according to work list. Comtan 200 mg is not available on the floor. AC called to get medication. Waiting on delivery of meds.

## 2014-05-19 NOTE — Progress Notes (Signed)
Have attempted to stimulate pt enough to take po medications. Pt  Is not following directions at this time and is very lethargic. Pt does not seem able to swallow pills  At this time. Pt does not seem to be in pain. Will continue to monitor.

## 2014-05-20 ENCOUNTER — Inpatient Hospital Stay (HOSPITAL_COMMUNITY): Payer: Medicare Other

## 2014-05-20 DIAGNOSIS — N3000 Acute cystitis without hematuria: Secondary | ICD-10-CM

## 2014-05-20 DIAGNOSIS — I6789 Other cerebrovascular disease: Secondary | ICD-10-CM

## 2014-05-20 DIAGNOSIS — G9340 Encephalopathy, unspecified: Secondary | ICD-10-CM

## 2014-05-20 LAB — COMPREHENSIVE METABOLIC PANEL
ALK PHOS: 46 U/L (ref 39–117)
ALT: 15 U/L (ref 0–53)
ANION GAP: 5 (ref 5–15)
AST: 20 U/L (ref 0–37)
Albumin: 3.5 g/dL (ref 3.5–5.2)
BUN: 20 mg/dL (ref 6–23)
CO2: 26 mmol/L (ref 19–32)
Calcium: 9.2 mg/dL (ref 8.4–10.5)
Chloride: 105 mmol/L (ref 96–112)
Creatinine, Ser: 1.01 mg/dL (ref 0.50–1.35)
GFR calc non Af Amer: 74 mL/min — ABNORMAL LOW (ref >90)
GFR, EST AFRICAN AMERICAN: 86 mL/min — AB (ref >90)
Glucose, Bld: 85 mg/dL (ref 70–99)
Potassium: 3.7 mmol/L (ref 3.5–5.1)
SODIUM: 136 mmol/L (ref 135–145)
Total Bilirubin: 1.8 mg/dL — ABNORMAL HIGH (ref 0.3–1.2)
Total Protein: 6.2 g/dL (ref 6.0–8.3)

## 2014-05-20 LAB — CBC
HCT: 44.3 % (ref 39.0–52.0)
Hemoglobin: 15.6 g/dL (ref 13.0–17.0)
MCH: 32.6 pg (ref 26.0–34.0)
MCHC: 35.2 g/dL (ref 30.0–36.0)
MCV: 92.5 fL (ref 78.0–100.0)
PLATELETS: 134 10*3/uL — AB (ref 150–400)
RBC: 4.79 MIL/uL (ref 4.22–5.81)
RDW: 13.1 % (ref 11.5–15.5)
WBC: 6.3 10*3/uL (ref 4.0–10.5)

## 2014-05-20 LAB — URINE CULTURE

## 2014-05-20 LAB — STREP PNEUMONIAE URINARY ANTIGEN: Strep Pneumo Urinary Antigen: NEGATIVE

## 2014-05-20 MED ORDER — AZITHROMYCIN 500 MG IV SOLR
500.0000 mg | INTRAVENOUS | Status: DC
  Filled 2014-05-20 (×2): qty 500

## 2014-05-20 MED ORDER — STROKE: EARLY STAGES OF RECOVERY BOOK
Freq: Once | Status: AC
  Administered 2014-05-20: 11:00:00
  Filled 2014-05-20: qty 1

## 2014-05-20 MED ORDER — VANCOMYCIN HCL IN DEXTROSE 1-5 GM/200ML-% IV SOLN
1000.0000 mg | Freq: Two times a day (BID) | INTRAVENOUS | Status: DC
  Administered 2014-05-21 – 2014-05-23 (×4): 1000 mg via INTRAVENOUS
  Filled 2014-05-20 (×6): qty 200

## 2014-05-20 MED ORDER — VANCOMYCIN HCL IN DEXTROSE 1-5 GM/200ML-% IV SOLN
1000.0000 mg | Freq: Once | INTRAVENOUS | Status: AC
  Administered 2014-05-21: 1000 mg via INTRAVENOUS
  Filled 2014-05-20: qty 200

## 2014-05-20 MED ORDER — VANCOMYCIN HCL IN DEXTROSE 1-5 GM/200ML-% IV SOLN
INTRAVENOUS | Status: AC
  Filled 2014-05-20: qty 200

## 2014-05-20 MED ORDER — CEFTRIAXONE SODIUM IN DEXTROSE 20 MG/ML IV SOLN
1.0000 g | INTRAVENOUS | Status: DC
  Administered 2014-05-20 – 2014-05-23 (×4): 1 g via INTRAVENOUS
  Filled 2014-05-20 (×5): qty 50

## 2014-05-20 MED ORDER — DEXTROSE 5 % IV SOLN
500.0000 mg | Freq: Every day | INTRAVENOUS | Status: DC
  Administered 2014-05-20: 500 mg via INTRAVENOUS
  Filled 2014-05-20: qty 500

## 2014-05-20 MED ORDER — VANCOMYCIN HCL 10 G IV SOLR
1500.0000 mg | Freq: Once | INTRAVENOUS | Status: AC
  Administered 2014-05-21: 1500 mg via INTRAVENOUS
  Filled 2014-05-20: qty 1500

## 2014-05-20 NOTE — Progress Notes (Signed)
ANTIBIOTIC CONSULT NOTE  Pharmacy Consult for Vancomycin Indication: rule out bacteremia  No Known Allergies  Patient Measurements: Height:  (190.5 cm) (per wife) Weight: 278 lb 3.5 oz (126.2 kg) IBW/kg (Calculated) : 84.5  Vital Signs: Temp: 97.6 F (36.4 C) (02/18 1619) Temp Source: Axillary (02/18 1619) BP: 134/93 mmHg (02/18 1800) Intake/Output from previous day: 02/17 0701 - 02/18 0700 In: -  Out: 500 [Urine:500] Intake/Output from this shift:    Labs:  Recent Labs  05/19/14 1123 05/20/14 0439  WBC 7.7 6.3  HGB 16.5 15.6  PLT 141* 134*  CREATININE 1.18 1.01   Estimated Creatinine Clearance: 100.2 mL/min (by C-G formula based on Cr of 1.01). No results for input(s): VANCOTROUGH, VANCOPEAK, VANCORANDOM, GENTTROUGH, GENTPEAK, GENTRANDOM, TOBRATROUGH, TOBRAPEAK, TOBRARND, AMIKACINPEAK, AMIKACINTROU, AMIKACIN in the last 72 hours.   Microbiology: Recent Results (from the past 720 hour(s))  Urine culture     Status: None   Collection Time: 05/19/14 12:30 PM  Result Value Ref Range Status   Specimen Description URINE, CATHETERIZED  Final   Special Requests NONE  Final   Colony Count   Final    >=100,000 COLONIES/ML Performed at [facility]    Culture   Final    Multiple bacterial morphotypes present, none predominant. Suggest appropriate recollection if clinically indicated. Performed at [facility]    Report Status 05/20/2014 FINAL  Final  Culture, blood (routine x 2)     Status: None (Preliminary result)   Collection Time: 05/19/14  2:18 PM  Result Value Ref Range Status   Specimen Description BLOOD RIGHT ARM DRAWN BY RN  Final   Special Requests BOTTLES DRAWN AEROBIC ONLY 5CC  Final   Culture   Final    GRAM POSITIVE COCCI IN CLUSTERS Gram Stain Report Called to,Read Back By and Verified With: STONE,A. AT 1910 ON 05/20/2014 BY BAUGHAM,M. Performed at [facility]    Report Status PENDING  Incomplete  Culture, blood  (routine x 2)     Status: None (Preliminary result)   Collection Time: 05/19/14  2:25 PM  Result Value Ref Range Status   Specimen Description BLOOD LEFT ARM  Final   Special Requests   Final    BOTTLES DRAWN AEROBIC AND ANAEROBIC AEB=9CC ANA=6CC   Culture NO GROWTH 1 DAY  Final   Report Status PENDING  Incomplete  MRSA PCR Screening     Status: None   Collection Time: 05/19/14  6:54 PM  Result Value Ref Range Status   MRSA by PCR NEGATIVE NEGATIVE Final    Comment:        The GeneXpert MRSA Assay (FDA approved for NASAL specimens only), is one component of a comprehensive MRSA colonization surveillance program. It is not intended to diagnose MRSA infection nor to guide or monitor treatment for MRSA infections.     Anti-infectives    Start     Dose/Rate Route Frequency Ordered Stop   05/20/14 1000  cefTRIAXone (ROCEPHIN) 1 g in dextrose 5 % 50 mL IVPB - Premix     1 g 100 mL/hr over 30 Minutes Intravenous Every 24 hours 05/20/14 0904 05/27/14 0959   05/20/14 1000  azithromycin (ZITHROMAX) 500 mg in dextrose 5 % 250 mL IVPB     500 mg 250 mL/hr over 60 Minutes Intravenous Every 24 hours 05/20/14 0904 05/27/14 0959   05/19/14 2200  piperacillin-tazobactam (ZOSYN) IVPB 3.375 g  Status:  Discontinued     3.375 g 12.5 mL/hr over 240  Minutes Intravenous Every 8 hours 05/19/14 1914 05/19/14 1915   05/19/14 2200  vancomycin (VANCOCIN) IVPB 1000 mg/200 mL premix  Status:  Discontinued     1,000 mg 200 mL/hr over 60 Minutes Intravenous Every 8 hours 05/19/14 1914 05/19/14 1915   05/19/14 2000  levofloxacin (LEVAQUIN) IVPB 750 mg  Status:  Discontinued     750 mg 100 mL/hr over 90 Minutes Intravenous Every 24 hours 05/19/14 1911 05/20/14 0904   05/19/14 1400  piperacillin-tazobactam (ZOSYN) IVPB 3.375 g     3.375 g 100 mL/hr over 30 Minutes Intravenous  Once 05/19/14 1349 05/19/14 1510   05/19/14 1400  vancomycin (VANCOCIN) IVPB 1000 mg/200 mL premix     1,000 mg 200 mL/hr over 60  Minutes Intravenous  Once 05/19/14 1349 05/19/14 1633      Assessment: 68 yoM admitted with UTI, CAP currently on Rocephin & Zithromax.  Asked to initiate empiric Vancomycin for 1/2 blood cx + GPCC.  Anticipate this is a contaminant, but will continue to monitor cx growth.  Patient is afebrile with normal WBC.   Excellent renal function. NCrcl ~ 70 ml/min.  Vancomycin 2/18>> Rocephin 2/17>> Zithromax 2/17>>  Goal of Therapy:  Vancomycin trough level 15-20 mcg/ml  Plan:  Vancomycin 2500mg  IV x1 now then 1000mg  IV q12h Check Vancomycin trough at steady state Monitor renal function and cx data  Duration of therapy per MD  Elson ClanLilliston, [place]me[place] Filice Michelle 05/20/2014,9:40 PM

## 2014-05-20 NOTE — Care Management Utilization Note (Signed)
UR completed 

## 2014-05-20 NOTE — Care Management Utilization Note (Signed)
CARE MANAGEMENT NOTE 05/20/2014  Patient:  Johnny PerchesMABE,Obadiah L   Account Number:  [id number]  Date Initiated:  05/20/2014  Documentation initiated by:  Sharday Michl  Subjective/Objective Assessment:   Pt admitted for sepsis. Pt is from home, has parkinson's and cared for by wife and neice. Pt is acutely encephelopathic and wife was contacted for assessment. Wife states home is small but her and the neice are have a wheelchair and     Action/Plan:   are able to get pt up using a slide board. Pt gets into recliner and spends his day there. Pt requires assistance with bathing and dressing. Pt recieves PCP, Dr. Deborha PaymentGandi, and neuro, Dr. Juliane LackLumar, [facility]   Anticipated DC Date:  05/23/2014   Anticipated DC Plan:  HOME W HOME HEALTH SERVICES      DC Planning Services  CM consult      [facility]   Choice offered to / List presented to:  C-3 Spouse        HH arranged  HH-1 RN      [facility]   Status of service:  In process, will continue to follow Medicare Important Message given?   (If response is "NO", the following Medicare IM given date fields will be blank) Date Medicare IM given:   Medicare IM given by:   Date Additional Medicare IM given:   Additional Medicare IM given by:    Discharge Disposition:    Per UR Regulation:  Reviewed for med. necessity/level of care/duration of stay  If discussed at Long Length of Stay Meetings, dates discussed:    Comments:  05/20/2014 1300 Kathyrn SheriffJessica Shavon Ashmore, RN, MSN, CM Pt's [place] [facility]. [facility]. Pt expceted to need a HH RN at discharge. Pt has used [facility] in the past and wife would like to use them again if needed. Will cont to follow.

## 2014-05-20 NOTE — Progress Notes (Signed)
PROGRESS NOTE  Johnny Wells ZOX:[id number] DOB: Aug 09, 1945 DOA: 05/19/2014 PCP: Kirstie Peri, MD  Summary: 69 year old male with history of Parkinson's disease and baseline left upper and left lower extremity weakness presented with 48 hours of generalized weakness, anorexia, lethargy. Admitted for early sepsis, CAP, UTI, subacute infarct right temporal lobe.  Assessment/Plan: 1. Possible early sepsis on admission. Lactic acid, pro-calcitonin normal. Hemodynamics stable. 2. CAP with fever and hypoxemia on ABG. Improving.  3. UTI. Culture pending. 4. Acute encephalopathy appears resolved 5. Subacute infarct right temporal lobe. No MRI with deep brain stimulator. No new focal deficits apparent. 6. Parkinson's disease, h/o deep brain stimulator placement. On Sinemet, entacapone as outpatient 7. LUE/LLE weakness at baseline complication of deep brain stimulator. 8. Chronic thrombocytopenia of unclear significance. At baseline. 9. 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   Improving; plan to continue empiric abx for UTI and CAP, follow-up culture data  Check sputum culture, legionella and strep urinary antigens  Check FLP, HgbA1c, echocardiogram and carotid u/s; neurology consult  Code Status: full code DVT prophylaxis: Xarelto Family Communication: none present Disposition Plan: home, likely next 48 hours  Brendia Sacks, MD  [facility]  Pager [phone] If 7PM-7AM, please contact night-coverage at [email or web address], password [facility] 05/20/2014, 8:13 AM  LOS: 1 day   Consultants:  Neurology  Procedures:    Antibiotics:  Ceftriaxone 2/18 >>  Azithromycin 2/18 >>  Levaquin 2/17  HPI/Subjective: Nursing noted lethargy overnight.  No complaints, no pain, breathing fine, wants to go home.  Objective: Filed Vitals:   05/20/14 0430 05/20/14 0500 05/20/14 0530 05/20/14 0630  BP: 113/70 127/74  133/77  Pulse: 63 36  69  Temp:   98.3 F (36.8 C)   TempSrc:   Axillary    Resp: Height:      Weight:  126.2 kg (278 lb 3.5 oz)    SpO2: 95% 100%  98%    Intake/Output Summary (Last 24 hours) at 05/20/14 0813 Last data filed at 05/20/14 0500  Gross per 24 hour  Intake      0 ml  Output    500 ml  Net   -500 ml     Filed Weights   05/19/14 1438 05/20/14 0500  Weight: 124.739 kg (275 lb) 126.2 kg (278 lb 3.5 oz)    Exam:     Tm 100.4, afebrile, vital signs stable. General: Appears calm and comfortable Eyes: pupils appear normal; normal lids, irises ENT: grossly normal hearing, lips & tongue Cardiovascular: RRR, no m/r/g. No LE edema. Telemetry: SR, no arrhythmias  Respiratory: CTA bilaterally, no w/r/r. Normal respiratory effort. Abdomen: soft, ntnd, obese Skin: no rash or induration seen  Musculoskeletal: grossly normal tone RUE/RLE, LUE contracture, able to lift left leg off the bed Psychiatric: grossly normal mood and affect, speech dysarthric (baseline), appropriate Neurologic: as above; follows commands  Data Reviewed:  Complete metabolic panel essentially unremarkable, modest isolated hyperbilirubinemia   platelet stable 134.  Pertinent data:  Labs  Chronic thrombocytopenia at baseline (seen 2012)  Imaging   Chest x-ray: Focal infiltrate left base  CT head: Subacute infarct right medial temporal lobe  Other    Pending data:  Blood cultures  Urine culture  Scheduled Meds: .  stroke: mapping our early stages of recovery book   Does not apply Once  . sodium chloride   Intravenous STAT  . carbidopa-levodopa  2 tablet Oral QID  . entacapone  200 mg Oral QID  . levofloxacin (LEVAQUIN) IV  750 mg Intravenous Q24H  . rivaroxaban  20 mg Oral Q supper  . sodium chloride  3 mL Intravenous Q12H   Continuous Infusions: . sodium chloride    . sodium chloride 100 mL/hr at 05/19/14 2010    Principal Problem:   Sepsis Active Problems:   CAP (community acquired pneumonia)   Acute encephalopathy   Generalized  weakness   Urinary tract infection   Parkinsonism   Stroke   Time spent 25 minutes

## 2014-05-20 NOTE — Progress Notes (Signed)
Clinical/Bedside Swallow Evaluation Patient Details  Name: MAGUIRE SIME MRN: [id number] Date of Birth: 1945-11-20  Today's Date: 05/20/2014 Time: SLP Start Time (ACUTE ONLY): 1201 SLP Stop Time (ACUTE ONLY): 1246 SLP Time Calculation (min) (ACUTE ONLY): 45 min  Past Medical History:  Past Medical History  Diagnosis Date  . Parkinson disease   . Left arm weakness    Past Surgical History:  Past Surgical History  Procedure Laterality Date  . Total hip arthroplasty Right   . Deep brain stimulator placement  1995   HPI:  Ottavio Lalani is a 69 year old male with history of Parkinson's disease and baseline left upper and left lower extremity weakness presented with 48 hours of generalized weakness, anorexia, lethargy. Admitted for early sepsis, CAP, UTI, subacute infarct right temporal lobe.Chest xray showed focal infiltrate left base. Pt also with h/o of left sided weakness from complication of deep brain stimulator placement. Pt denies difficulty swallowing. SLP asked to evaluate swallow due to h/o parkinson's, subacute CVA, and PNA.    Assessment / Plan / Recommendation Clinical Impression  Mr. Chilcote was agreeable to swallow evaluation and denies any difficulties swallowing. He states that he occasionally coughs ("not more than anybody else"). Pt edentulous, had dentures but lost them several months ago. He exhibited some lingual thrust when cued to swallow and between bites of foods. Pt slow to respond, keeps eyes closed, speech dysarthric but improves with cues. No overt orofacial weakness. Pt does lean to the left (important to note that chest xray shows focal infiltrate left base).   He consumed 100% of his lunch tray when I fed him. He had some difficulty switching from spoon presentation to straw presentation and at one point lifted his wash cloth and placed in mouth to chew. He was redirected with simple verbal cue. He had two episodes delayed, but strong coughing during rest breaks. Pt  cued to swallow 2x for each bite/sip to ensure better pharyngeal clearance of any possible residue post initial swallow. He verbalized understanding.   Given history of PD, left sided weakness, and current left sided focal infiltrate, pt does have appreciable risk for aspiration, however risks can be minimized by decreasing rate of intake and swallowing 2x with each bite/sip. Swallowing should be monitored going forward. He may also benefit from speech evaluation for dysarthria if dysarthria persists (pt fairly tired/lethargic at the moment). SLP to follow for diet tolerance and pt/caregiver education while in acute setting.    Aspiration Risk  Mild    Diet Recommendation Dysphagia 3 (Mechanical Soft);Thin liquid (due to edentulous status)   Liquid Administration via: Cup;Straw Medication Administration: Whole meds with liquid Supervision: Staff to assist with self feeding;Intermittent supervision to cue for compensatory strategies Compensations: Slow rate;Multiple dry swallows after each bite/sip;Follow solids with liquid Postural Changes and/or Swallow Maneuvers: Seated upright 90 degrees;Upright 30-60 min after meal    Other  Recommendations Oral Care Recommendations: Oral care BID Other Recommendations: Clarify dietary restrictions   Follow Up Recommendations   (pending)    Frequency and Duration min 2x/week  1 week   Pertinent Vitals/Pain VSS    SLP Swallow Goals   Pt will demonstrate safe and efficient consumption of least restrictive diet with use of strategies as needed.   Swallow Study Prior Functional Status   Lives at home with spouse    General Date of Onset: 05/19/14 HPI: Riyan Haile is a 69 year old male with history of Parkinson's disease and baseline left upper and left lower extremity weakness presented  with 48 hours of generalized weakness, anorexia, lethargy. Admitted for early sepsis, CAP, UTI, subacute infarct right temporal lobe.Chest xray showed focal  infiltrate left base. Pt also with h/o of left sided weakness from complication of deep brain stimulator placement. Pt denies difficulty swallowing. SLP asked to evaluate swallow due to h/o parkinson's, subacute CVA, and PNA.  Type of Study: Bedside swallow evaluation Previous Swallow Assessment: None on record Diet Prior to this Study: Regular;Thin liquids Temperature Spikes Noted: Yes (at admission) Respiratory Status: Room air History of Recent Intubation: No Behavior/Cognition: Cooperative;Pleasant mood;Requires cueing Oral Cavity - Dentition: Edentulous Self-Feeding Abilities: Able to feed self;Needs assist Patient Positioning: Upright in bed Baseline Vocal Quality: Clear;Low vocal intensity Volitional Cough: Strong Volitional Swallow: Able to elicit    Oral/Motor/Sensory Function Overall Oral Motor/Sensory Function: Appears within functional limits for tasks assessed Labial ROM: Within Functional Limits Labial Symmetry: Within Functional Limits Labial Strength: Within Functional Limits Labial Sensation: Within Functional Limits Lingual ROM: Within Functional Limits Lingual Symmetry: Within Functional Limits Lingual Strength: Within Functional Limits Lingual Sensation: Within Functional Limits Facial ROM: Within Functional Limits Facial Symmetry: Within Functional Limits Facial Strength: Within Functional Limits Facial Sensation: Within Functional Limits Velum: Within Functional Limits Mandible: Within Functional Limits   Ice Chips Ice chips: Not tested   Thin Liquid Thin Liquid: Within functional limits Presentation: Straw    Nectar Thick Nectar Thick Liquid: Within functional limits Presentation: Spoon (nectar from peaches given)   Honey Thick Honey Thick Liquid: Not tested   Puree Puree: Within functional limits Presentation: Spoon   Solid   Thank you,  Havery MorosDabney Jolane Bankhead, CCC-SLP [phone]     Solid: Within functional limits Presentation: Spoon (Pt edentulous, a  little tongue thrust, meat cut into small p)       Laurence Crofford 05/20/2014,1:06 PM

## 2014-05-20 NOTE — Progress Notes (Signed)
  Echocardiogram 2D Echocardiogram has been performed.  Delcie RochENNINGTON, Kathyrn Warmuth 05/20/2014, 10:40 AM

## 2014-05-20 NOTE — Consult Note (Signed)
Johnny A. Merlene Laughter, MD     [email or web address]          Johnny Wells is an 69 y.o. male.   ASSESSMENT/PLAN: 1. Acute encephalopathy due to acute infectious processes and over from acute Pylonephritis and the community-acquired pneumonia. I suspect that the patient's cognition should continue to improve and he should return to baseline. 2. Severe baseline gait impairment. This is most likely multifactorial including severe axial parkinsonism and severe osteoarthritis. 3. 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 Unfortunately, most of the patient's current debilitating symptoms are due to axial Parkinson symptoms which are notoriously extremely difficult to treat with current available therapies. Physical therapy may be beneficial. We will continue with the his medications at the current doses. The patient's appendicular symptoms are well treated with the current regimen. 4. Subacute right temporal infarct unclear etiology. The patient already is on anticoagulation. I would suggest continuing with this therapy. Adding an antiplatelet agents probably is not as beneficial at this point in time. I suspect that this will increase the risk of hemorrhage without additional benefit. We will follow up the echocardiography. 5. It appears to be some episodes of unresponsiveness this is concerning for possible complex partial seizures especially as a sequelae of the patient's right temporal infarct. EEG is suggested. 6. 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  The patient is a 69 year old white male who has a long-standing history of Parkinson disease. The patient is status post deep brain stimulator placement in the 90s. The patient is on treatment with Sinemet and Comtan for his symptoms. It appears that the patient has been followed at the [facility] for his Parkinson's disease. I did speak to the wife. His baseline at this point is  that he essentially requires maximal assistance. He does not ambulate. He is bedridden. It appears that over the last few days the patient became more confused and unresponsive. As part of the evaluation, a CT scan was obtained. It appears that there was a hypodensity involving the right temporal region and hence the neurological consultation. The wife does not report the patient having a stroke distinctly. Further questioning however reveals that approximately 2 months ago they noted that the patient had the weakness of the left upper extremity. The patient is on Xarelto for unclear reasons. I did try to tease this out repeatedly but I cannot get a clear answer. She tells me that in 2006, he was having episodes of unresponsiveness and was taken to [facility] on the direction of Dr. Liane Wells his previous primary care provider. During that evaluation, she was told he needed to be on Xarelto. The review of systems is limited given the impaired cognition.  GENERAL: Patient is in no acute distress. He is asking to go home.  HEENT: Supple. Atraumatic normocephalic.   ABDOMEN: soft  EXTREMITIES: No edema. Severe arthritic changes especially of the knees bilaterally.   BACK: Normal.  SKIN: Normal by inspection.    MENTAL STATUS: He is awake and alert. He thinks he is in the hospital. He states the year 2014 the month is March and he states his age is 70. He does follow commands briskly. The patient has severe dysarthria mostly due to severe hypophonia likely from end-stage Parkinson disease.  CRANIAL NERVES: Pupils are equal, round and reactive to light and accommodation; extra ocular movements are full, there is no significant nystagmus; visual fields are full; upper and lower facial muscles are normal  in strength and symmetric, there is no flattening of the nasolabial folds; tongue is midline; uvula is midline; shoulder elevation is normal.  MOTOR: Normal tone, bulk and strength; no  pronator drift.  COORDINATION: Left finger to nose is normal, right finger to nose is normal, No rest tremor; no intention tremor; no postural tremor. Mild to moderate impairment of hand dexterity.  REFLEXES: Deep tendon reflexes are symmetrical and normal. Babinski reflexes are flexor bilaterally.   SENSATION: Limited due to the impaired cognition but appears respond to painful some light bilaterally.    Blood pressure 133/77, pulse 69, temperature 97.6 F (36.4 C), temperature source Axillary, resp. rate 16, height $RemoveBe'6\' 3"'[id number]$  (1.905 m), weight 126.2 kg (278 lb 3.5 oz), SpO2 98 %.  Past Medical History  Diagnosis Date  . Parkinson disease   . Left arm weakness     Past Surgical History  Procedure Laterality Date  . Total hip arthroplasty Right   . Deep brain stimulator placement  1995    Family History  Problem Relation Age of Onset  . Stroke Mother   . Diabetes Mother   . Heart attack Brother     Social History:  reports that he quit smoking about 6 weeks ago. He does not have any smokeless tobacco history on file. His alcohol and drug histories are not on file.  Allergies: No Known Allergies  Medications: Prior to Admission medications   Medication Sig Start Date End Date Taking? Authorizing Provider  carbidopa-levodopa (SINEMET IR) 25-100 MG per tablet Take 2 tablets by mouth 4 (four) times daily.   Yes Historical Provider, MD  entacapone (COMTAN) 200 MG tablet Take 200 mg by mouth 4 (four) times daily.   Yes Historical Provider, MD  rivaroxaban (XARELTO) 20 MG TABS tablet Take 20 mg by mouth daily with supper.   Yes Historical Provider, MD    Scheduled Meds: . azithromycin  500 mg Intravenous Q24H  . carbidopa-levodopa  2 tablet Oral QID  . cefTRIAXone (ROCEPHIN)  IV  1 g Intravenous Q24H  . entacapone  200 mg Oral QID  . rivaroxaban  20 mg Oral Q supper  . sodium chloride  3 mL Intravenous Q12H   Continuous Infusions:  PRN Meds:.acetaminophen **OR**  acetaminophen, ondansetron **OR** ondansetron (ZOFRAN) IV     Results for orders placed or performed during the hospital encounter of 05/19/14 (from the past 48 hour(s))  Basic metabolic panel  (at [facility])     Status: Abnormal   Collection Time: 05/19/14 11:23 AM  Result Value Ref Range   Sodium 138 135 - 145 mmol/L   Potassium 3.9 3.5 - 5.1 mmol/L   Chloride 104 96 - 112 mmol/L   CO2 28 19 - 32 mmol/L   Glucose, Bld 95 70 - 99 mg/dL   BUN 20 6 - 23 mg/dL   Creatinine, Ser 1.18 0.50 - 1.35 mg/dL   Calcium 9.9 8.4 - 10.5 mg/dL   GFR calc non Af Amer 62 (L) >90 mL/min   GFR calc Af Amer 71 (L) >90 mL/min    Comment: (NOTE) The eGFR has been calculated using the CKD EPI equation. This calculation has not been validated in all clinical situations. eGFR's persistently <90 mL/min signify possible Chronic Kidney Disease.    Anion gap 6 5 - 15  CBC  (at [facility])     Status: Abnormal   Collection Time: 05/19/14 11:23 AM  Result Value Ref Range   WBC 7.7  4.0 - 10.5 K/uL   RBC 5.02 4.22 - 5.81 MIL/uL   Hemoglobin 16.5 13.0 - 17.0 g/dL   HCT 47.0 39.0 - 52.0 %   MCV 93.6 78.0 - 100.0 fL   MCH 32.9 26.0 - 34.0 pg   MCHC 35.1 30.0 - 36.0 g/dL   RDW 13.4 11.5 - 15.5 %   Platelets 141 (L) 150 - 400 K/uL  Urinalysis, Routine w reflex microscopic     Status: Abnormal   Collection Time: 05/19/14 12:30 PM  Result Value Ref Range   Color, Urine AMBER (A) YELLOW    Comment: BIOCHEMICALS MAY BE AFFECTED BY COLOR   APPearance HAZY (A) CLEAR   Specific Gravity, Urine 1.015 1.005 - 1.030   pH 8.5 (H) 5.0 - 8.0   Glucose, UA NEGATIVE NEGATIVE mg/dL   Hgb urine dipstick LARGE (A) NEGATIVE   Bilirubin Urine NEGATIVE NEGATIVE   Ketones, ur TRACE (A) NEGATIVE mg/dL   Protein, ur 30 (A) NEGATIVE mg/dL   Urobilinogen, UA 4.0 (H) 0.0 - 1.0 mg/dL   Nitrite POSITIVE (A) NEGATIVE   Leukocytes, UA MODERATE (A) NEGATIVE  Urine culture     Status: None   Collection Time:  05/19/14 12:30 PM  Result Value Ref Range   Specimen Description URINE, CATHETERIZED    Special Requests NONE    Colony Count      >=100,000 COLONIES/ML Performed at [facility]    Culture      Multiple bacterial morphotypes present, none predominant. Suggest appropriate recollection if clinically indicated. Performed at [facility]    Report Status 05/20/2014 FINAL   Urine microscopic-add on     Status: Abnormal   Collection Time: 05/19/14 12:30 PM  Result Value Ref Range   WBC, UA 7-10 <3 WBC/hpf   RBC / HPF 7-10 <3 RBC/hpf   Bacteria, UA MANY (A) RARE  CBG, ED     Status: None   Collection Time: 05/19/14 12:32 PM  Result Value Ref Range   Glucose-Capillary 95 70 - 99 mg/dL  Culture, blood (routine x 2)     Status: None (Preliminary result)   Collection Time: 05/19/14  2:18 PM  Result Value Ref Range   Specimen Description BLOOD RIGHT ARM DRAWN BY RN    Special Requests BOTTLES DRAWN AEROBIC ONLY 5CC    Culture NO GROWTH 1 DAY    Report Status PENDING   Culture, blood (routine x 2)     Status: None (Preliminary result)   Collection Time: 05/19/14  2:25 PM  Result Value Ref Range   Specimen Description BLOOD LEFT ARM    Special Requests      BOTTLES DRAWN AEROBIC AND ANAEROBIC AEB=9CC ANA=6CC   Culture NO GROWTH 1 DAY    Report Status PENDING   I-Stat CG4 Lactic Acid, ED     Status: None   Collection Time: 05/19/14  2:29 PM  Result Value Ref Range   Lactic Acid, Venous 1.38 0.5 - 2.0 mmol/L  Procalcitonin     Status: None   Collection Time: 05/19/14  6:20 PM  Result Value Ref Range   Procalcitonin <0.10 ng/mL    Comment:        Interpretation: PCT (Procalcitonin) <= 0.5 ng/mL: Systemic infection (sepsis) is not likely. Local bacterial infection is possible. (NOTE)         ICU PCT Algorithm               Non ICU PCT Algorithm    ----------------------------     ------------------------------  PCT < 0.25 ng/mL                 PCT < 0.1  ng/mL     Stopping of antibiotics            Stopping of antibiotics       strongly encouraged.               strongly encouraged.    ----------------------------     ------------------------------       PCT level decrease by               PCT < 0.25 ng/mL       >= 80% from peak PCT       OR PCT 0.25 - 0.5 ng/mL          Stopping of antibiotics                                             encouraged.     Stopping of antibiotics           encouraged.    ----------------------------     ------------------------------       PCT level decrease by              PCT >= 0.25 ng/mL       < 80% from peak PCT        AND PCT >= 0.5 ng/mL            Continuin g antibiotics                                              encouraged.       Continuing antibiotics            encouraged.    ----------------------------     ------------------------------     PCT level increase compared          PCT > 0.5 ng/mL         with peak PCT AND          PCT >= 0.5 ng/mL             Escalation of antibiotics                                          strongly encouraged.      Escalation of antibiotics        strongly encouraged.   Protime-INR     Status: Abnormal   Collection Time: 05/19/14  6:20 PM  Result Value Ref Range   Prothrombin Time 15.7 (H) 11.6 - 15.2 seconds   INR 1.24 0.00 - 1.49  APTT     Status: Abnormal   Collection Time: 05/19/14  6:20 PM  Result Value Ref Range   aPTT 40 (H) 24 - 37 seconds    Comment:        IF BASELINE aPTT IS ELEVATED, SUGGEST PATIENT RISK ASSESSMENT BE USED TO DETERMINE APPROPRIATE ANTICOAGULANT THERAPY.   Blood gas, arterial     Status: Abnormal   Collection Time: 05/19/14  6:35 PM  Result Value Ref Range   FIO2 21.00 %  Delivery systems ROOM AIR    pH, Arterial 7.402 7.350 - 7.450   pCO2 arterial 41.9 35.0 - 45.0 mmHg   pO2, Arterial 62.7 (L) 80.0 - 100.0 mmHg   Bicarbonate 25.6 (H) 20.0 - 24.0 mEq/L   TCO2 21.9 0 - 100 mmol/L   Acid-Base Excess 1.3 0.0 - 2.0  mmol/L   O2 Saturation 91.8 %   Patient temperature 37.0    Collection site LEFT RADIAL    Drawn by [id number]    Sample type ARTERIAL    Allens test (pass/fail) PASS PASS  MRSA PCR Screening     Status: None   Collection Time: 05/19/14  6:54 PM  Result Value Ref Range   MRSA by PCR NEGATIVE NEGATIVE    Comment:        The GeneXpert MRSA Assay (FDA approved for NASAL specimens only), is one component of a comprehensive MRSA colonization surveillance program. It is not intended to diagnose MRSA infection nor to guide or monitor treatment for MRSA infections.   CBC     Status: Abnormal   Collection Time: 05/20/14  4:39 AM  Result Value Ref Range   WBC 6.3 4.0 - 10.5 K/uL   RBC 4.79 4.22 - 5.81 MIL/uL   Hemoglobin 15.6 13.0 - 17.0 g/dL   HCT 44.3 39.0 - 52.0 %   MCV 92.5 78.0 - 100.0 fL   MCH 32.6 26.0 - 34.0 pg   MCHC 35.2 30.0 - 36.0 g/dL   RDW 13.1 11.5 - 15.5 %   Platelets 134 (L) 150 - 400 K/uL  Comprehensive metabolic panel     Status: Abnormal   Collection Time: 05/20/14  4:39 AM  Result Value Ref Range   Sodium 136 135 - 145 mmol/L   Potassium 3.7 3.5 - 5.1 mmol/L   Chloride 105 96 - 112 mmol/L   CO2 26 19 - 32 mmol/L   Glucose, Bld 85 70 - 99 mg/dL   BUN 20 6 - 23 mg/dL   Creatinine, Ser 1.01 0.50 - 1.35 mg/dL   Calcium 9.2 8.4 - 10.5 mg/dL   Total Protein 6.2 6.0 - 8.3 g/dL   Albumin 3.5 3.5 - 5.2 g/dL   AST 20 0 - 37 U/L   ALT 15 0 - 53 U/L   Alkaline Phosphatase 46 39 - 117 U/L   Total Bilirubin 1.8 (H) 0.3 - 1.2 mg/dL   GFR calc non Af Amer 74 (L) >90 mL/min   GFR calc Af Amer 86 (L) >90 mL/min    Comment: (NOTE) The eGFR has been calculated using the CKD EPI equation. This calculation has not been validated in all clinical situations. eGFR's persistently <90 mL/min signify possible Chronic Kidney Disease.    Anion gap 5 5 - 15  Strep pneumoniae urinary antigen     Status: None   Collection Time: 05/20/14  9:50 AM  Result Value Ref Range   Strep  Pneumo Urinary Antigen NEGATIVE NEGATIVE    Comment:        Infection due to S. pneumoniae cannot be absolutely ruled out since the antigen present may be below the detection limit of the test. Performed at [facility]     Studies/Results:  HEAD CT There is no evidence of mass effect, midline shift or extra-axial fluid collections. There is no evidence of a space-occupying lesion or intracranial hemorrhage. Mild low attenuation in the right medial temporal lobe temporal lobe concerning for a subacute infarct. There are bilateral deep brain  stimulator wires terminating in the thalamus. Mild bilateral periventricular white matter low attenuation likely secondary to microangiopathy.  The ventricles and sulci are appropriate for the patient's age. The basal cisterns are patent.  Visualized portions of the orbits are unremarkable. Mild bilateral maxillary sinus mucosal thickening. Mastoid sinuses are clear.  The osseous structures are unremarkable.  IMPRESSION: 1. Mild low attenuation in the right medial temporal lobe temporal lobe concerning for a subacute infarct.   The brain CT scan is reviewed in person. Hyperdensities are seen in the frontal region extending to the basal ganglia area consistent with deep brain stimulator leads. There is a right temporal hypodensity below the lead on the right side. It is unclear age but appears more subacute to chronic.   Burnice Vassel A. Merlene Wells, M.D.  Diplomate, Tax adviser of Psychiatry and Neurology ( Neurology). 05/20/2014, 6:38 PM

## 2014-05-21 ENCOUNTER — Inpatient Hospital Stay (HOSPITAL_COMMUNITY)
Admission: EM | Admit: 2014-05-21 | Discharge: 2014-05-21 | Disposition: A | Discharge status: Home / Self Care | Payer: Medicare Other | Source: Home / Self Care | Attending: Family Medicine | Admitting: Family Medicine

## 2014-05-21 ENCOUNTER — Encounter (HOSPITAL_COMMUNITY): Payer: Self-pay | Admitting: *Deleted

## 2014-05-21 DIAGNOSIS — R7881 Bacteremia: Secondary | ICD-10-CM

## 2014-05-21 LAB — LIPID PANEL
CHOL/HDL RATIO: 7.4 ratio
CHOLESTEROL: 162 mg/dL (ref 0–200)
HDL: 22 mg/dL — AB (ref >39)
LDL Cholesterol: 115 mg/dL — ABNORMAL HIGH (ref 0–99)
TRIGLYCERIDES: 125 mg/dL (ref <150)
VLDL: 25 mg/dL (ref 0–40)

## 2014-05-21 LAB — LEGIONELLA ANTIGEN, URINE

## 2014-05-21 LAB — GRAM STAIN

## 2014-05-21 LAB — TSH: TSH: 2.419 u[IU]/mL (ref 0.350–4.500)

## 2014-05-21 MED ORDER — AZITHROMYCIN 250 MG PO TABS
500.0000 mg | ORAL_TABLET | Freq: Every day | ORAL | Status: DC
  Administered 2014-05-21 – 2014-05-23 (×3): 500 mg via ORAL
  Filled 2014-05-21 (×3): qty 2

## 2014-05-21 NOTE — Progress Notes (Signed)
EEG Completed; Results Pending  

## 2014-05-21 NOTE — Care Management Note (Addendum)
    Page 1 of 2   05/24/2014     12:47:10 PM CARE MANAGEMENT NOTE 05/24/2014  Patient:  Johnny Wells,Johnny Wells   Account Number:  [id number]  Date Initiated:  05/20/2014  Documentation initiated by:  CHILDRESS,JESSICA  Subjective/Objective Assessment:   Pt admitted for sepsis. Pt is from home, has parkinson's and cared for by wife and neice. Pt is acutely encephelopathic and wife was contacted for assessment. Wife states home is small but her and the neice are have a wheelchair and     Action/Plan:   are able to get pt up using a slide board. Pt gets into recliner and spends his day there. Pt requires assistance with bathing and dressing. Pt recieves PCP, Dr. Deborha PaymentGandi, and neuro, Dr. Juliane LackLumar, [facility]   Anticipated DC Date:  05/23/2014   Anticipated DC Plan:  HOME W HOME HEALTH SERVICES      DC Planning Services  CM consult      [facility]   Choice offered to / List presented to:  C-3 Spouse        HH arranged  HH-1 RN  HH-4 NURSE'S AIDE      HH agency  [facility]   Status of service:  Completed, signed off Medicare Important Message given?  YES (If response is "NO", the following Medicare IM given date fields will be blank) Date Medicare IM given:  05/21/2014 Medicare IM given by:  Kathyrn SheriffHILDRESS,JESSICA Date Additional Medicare IM given:  05/24/2014 Additional Medicare IM given by:  Sharrie RothmanAMMY C [place]  [facility]  Per UR Regulation:  Reviewed for med. necessity/level of care/duration of stay  If discussed at Long Length of Stay Meetings, dates discussed:    Comments:  05/24/14 1235 Arlyss Queenammy Jayvian Escoe, RN BSN CM Pt [facility] (per wife choice). Alroy BailiffLinda Lothian [facility] aware and will collect the pts information from the chart. HH services to start within 48 hours of discharge. Pt has a hospital bed at home. List of private duty agencies and Advanced Directive  paperwork left in room per pts wife request. Pt will need [facility] for transportation and CSW will arrange. Pts wife and pts nurse aware of discharge arrangements.  05/21/2014 1500 Kathyrn SheriffJessica Childress, RN, MSN, CM  Left instructions for RN to [facility] and fax orders if pt dishcarged over weekend. No further CM needs.  05/20/2014 1300 Kathyrn SheriffJessica Childress, RN, MSN, CM [place] [facility] [place] [facility] [place] has been [place]. [facility]. Pt expceted to need a HH RN at discharge. Pt has used [facility] in the past and wife would like to use them again if needed. Will cont to follow.

## 2014-05-21 NOTE — Progress Notes (Signed)
Report called to Schering-PloughCrystal, Charity fundraiserN. Patient transferred to [id number] with RN in stable condition. Family aware and waiting in room on 300.

## 2014-05-21 NOTE — Progress Notes (Addendum)
Speech Language Pathology Treatment: Dysphagia  Patient Details Name: Majestic L Hove MRN: [id number] DOB: 12Kerri Perches/15/1947 Today's Date: 05/21/2014 Time: [id number] SLP Time Calculation (min) (ACUTE ONLY): 14 min  Assessment / Plan / Recommendation Clinical Impression  Dysphagia treatment provided today for diet tolerance check and pt education/ compensatory strategy training. The pt demonstrated no overt s/s of aspiration at bedside. Pt demonstrates significant tongue pumping which results in what appears to be delayed oral transit of boluses. Pt was very fatigued and needed frequent prompts to remain alert. Recommend continuing with dysphagia 3 diet/ thin liquids, meds whole in liquid, supervision during meals for feeding and cueing for small bites/ sips. ST will continue to follow at least x1 for diet tolerance check and SLE- pt needed frequent prompting to remain alert at bedside- very minimal verbal output which was mostly unintelligible and given fatigue, it is unclear whether this is truly how unintelligible pt is typically.    HPI HPI: Carleene CooperDarryl Mccomb is a 69 year old male with history of Parkinson's disease and baseline left upper and left lower extremity weakness presented with 48 hours of generalized weakness, anorexia, lethargy. Admitted for early sepsis, CAP, UTI, subacute infarct right temporal lobe.Chest xray showed focal infiltrate left base. Pt also with h/o of left sided weakness from complication of deep brain stimulator placement. Pt denies difficulty swallowing. SLP asked to evaluate swallow due to h/o parkinson's, subacute CVA, and PNA.    Pertinent Vitals Pain Assessment: No/denies pain  SLP Plan  Continue with current plan of care    Recommendations Diet recommendations: Dysphagia 3 (mechanical soft);Thin liquid Liquids provided via: Straw Medication Administration: Whole meds with liquid Supervision: Staff to assist with self feeding;Intermittent supervision to cue for compensatory  strategies Compensations: Slow rate;Multiple dry swallows after each bite/sip;Follow solids with liquid Postural Changes and/or Swallow Maneuvers: Seated upright 90 degrees;Upright 30-60 min after meal              Oral Care Recommendations: Oral care BID Follow up Recommendations: Other (comment) (pending- does pt need speech language evaluation?) Plan: Continue with current plan of care    GO     Metro KungOleksiak, Amarii  K, MA, CCC-SLP 05/21/2014, 7:05 PM

## 2014-05-21 NOTE — Procedures (Signed)
  [facility] Johnny Wells A. Gerilyn Pilgrimoonquah, MD     [email or web address]           HISTORY: The patient presents with altered mental status and episodes of unresponsiveness suspicious for complex partial seizures.   MEDICATIONS: Scheduled Meds: . azithromycin  500 mg Oral QHS  . carbidopa-levodopa  2 tablet Oral QID  . cefTRIAXone (ROCEPHIN)  IV  1 g Intravenous Q24H  . entacapone  200 mg Oral QID  . rivaroxaban  20 mg Oral Q supper  . sodium chloride  3 mL Intravenous Q12H  . vancomycin  1,000 mg Intravenous Q12H   Continuous Infusions:  PRN Meds:.acetaminophen **OR** acetaminophen, ondansetron **OR** ondansetron (ZOFRAN) IV  Prior to Admission medications   Medication Sig Start Date End Date Taking? Authorizing Provider  carbidopa-levodopa (SINEMET IR) 25-100 MG per tablet Take 2 tablets by mouth 4 (four) times daily.   Yes Historical Provider, MD  entacapone (COMTAN) 200 MG tablet Take 200 mg by mouth 4 (four) times daily.   Yes Historical Provider, MD  rivaroxaban (XARELTO) 20 MG TABS tablet Take 20 mg by mouth daily with supper.   Yes Historical Provider, MD      ANALYSIS: A 16 channel recording using standard 10 20 measurements is conducted for 20 minutes. The background activity is as high as 5-1/2 Hz bilaterally. Awake and drowsy activities are observed. Photic simulation and hyperventilation are not carried out. There are no focal or lateral slowing. There is no epileptiform activity observed.   IMPRESSION: This recording is abnormal showing a moderately severe global generalized slowing indicated a moderate to severe generalized encephalopathy. However, there is no epileptiform activity observed.       Makailyn Mccormick A. Gerilyn Pilgrimoonquah, M.D.  Diplomate, Biomedical engineerAmerican Board of Psychiatry and Neurology ( Neurology).

## 2014-05-21 NOTE — Progress Notes (Signed)
PROGRESS NOTE  Johnny Wells JXB:[id number] DOB: 11-27-1945 DOA: 05/19/2014 PCP: Kirstie Peri, MD  Summary: 69 year old male with history of Parkinson's disease and baseline left upper and left lower extremity weakness presented with 48 hours of generalized weakness, anorexia, lethargy. Admitted for early sepsis, CAP, UTI, subacute infarct right temporal lobe.  Assessment/Plan: 1. Possible early sepsis on admission. Resolved. Lactic acid, pro-calcitonin normal. Hemodynamics stable. 2. CAP with fever and hypoxemia on ABG. Afebrile 24 hours. Respiratory status stable.  3. GPC in clusters 1/2 blood cultures. Suspect contaminant but continue vancomycin for now. 4. UTI. Culture unrevealing, >100k, multiple bacterial morphotypes. 5. Acute encephalopathy, much improved. 6. Subacute infarct right temporal lobe. No MRI with deep brain stimulator. No new focal deficits apparent. Per neurology continue anticoagulation, do not add anti-platelet therapy. Echocardiogram and bilateral carotid dopplers unremarkable 7. Parkinson's disease, with associated severe gait impairment. H/o deep brain stimulator placement. On Sinemet, entacapone as outpatient 8. LUE/LLE weakness at baseline complication of deep brain stimulator. 9. Chronic thrombocytopenia of unclear significance. At baseline. 10. 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   Continues to improve; hemodynamics stable. Suspect bacteremia is spurious. For now continue abx including vancomycin, follow up sputum and blood cultures.  EEG per neurology   Plan transfer to telemetry  Code Status: full code DVT prophylaxis: Xarelto Family Communication: none present Disposition Plan: home, approximately 2 days  Brendia Sacks, MD  [facility]  Pager [phone] If 7PM-7AM, please contact night-coverage at [email or web address], password [facility] 05/21/2014, 8:58 AM  LOS: 2 days   Consultants:  Neurology  ST: dysphagia 3, thin  liquid  Procedures:  2-D echocardiogram Study Conclusions  - Procedure narrative: Transthoracic echocardiography. Image quality was suboptimal, with poor right-sided chamber and valvular visualization. - Left ventricle: The cavity size was normal. There was mild concentric hypertrophy. Systolic function was normal. The estimated ejection fraction was in the range of 60% to 65%. Wall motion was normal; there were no regional wall motion abnormalities. Findings consistent with left ventricular diastolic dysfunction. - Aortic valve: There was mild regurgitation. - Mitral valve: Mildly calcified annulus. Mildly thickened leaflets . There was mild regurgitation. - Atrial septum: Poorly visualized.  Antibiotics:  Ceftriaxone 2/18 >>  Azithromycin 2/18 >>  Vancomycin 2/18 >>  Levaquin 2/17  HPI/Subjective: EEG recommended by neurology. GPC in clusters 1/2 bottles.  Feels fine. No SOB, no significant cough. No n/v. No abdominal pain. "When can I go home?"  Objective: Filed Vitals:   05/21/14 0300 05/21/14 0400 05/21/14 0500 05/21/14 0700  BP: 100/67 126/73 129/73   Pulse: 69 67 64   Temp:  97.6 F (36.4 C)  97.5 F (36.4 C)  TempSrc:  Axillary  Oral  Resp: Height:      Weight:   125.5 kg (276 lb 10.8 oz)   SpO2: 96% 92% 96%     Intake/Output Summary (Last 24 hours) at 05/21/14 0858 Last data filed at 05/21/14 0700  Gross per 24 hour  Intake 3084.17 ml  Output   1800 ml  Net 1284.17 ml     Filed Weights   05/19/14 1438 05/20/14 0500 05/21/14 0500  Weight: 124.739 kg (275 lb) 126.2 kg (278 lb 3.5 oz) 125.5 kg (276 lb 10.8 oz)    Exam:     Afebrile, vital signs stable. General:  Appears calm and comfortable; appears better today, speech clearer Cardiovascular: RRR, no m/r/g. No LE edema. Telemetry: SR, no arrhythmias  Respiratory: CTA bilaterally, no w/r/r. Normal respiratory effort.  Abdomen: soft, ntnd, obese Musculoskeletal:  no change, moves both legs to command; LUE contracture Psychiatric: grossly normal mood and affect, speech fluent and appropriate today. Oriented to self, APH, not month or year Neurologic: no apparent change  Data Reviewed:  LDL 115  UOP 1800  Pertinent data:  Labs  Chronic thrombocytopenia at baseline (seen 2012)  Imaging   Chest x-ray: Focal infiltrate left base  CT head: Subacute infarct right medial temporal lobe   Bilateral carotid u/s with significant obstruction. Other    Pending data:  Blood cultures  Urine culture  Scheduled Meds: . azithromycin  500 mg Oral QHS  . carbidopa-levodopa  2 tablet Oral QID  . cefTRIAXone (ROCEPHIN)  IV  1 g Intravenous Q24H  . entacapone  200 mg Oral QID  . rivaroxaban  20 mg Oral Q supper  . sodium chloride  3 mL Intravenous Q12H  . vancomycin  1,000 mg Intravenous Q12H   Continuous Infusions:    Principal Problem:   Sepsis Active Problems:   CAP (community acquired pneumonia)   Acute encephalopathy   Generalized weakness   Urinary tract infection   Parkinsonism   Stroke   Time spent 20 minutes

## 2014-05-21 NOTE — Evaluation (Signed)
Occupational Therapy Evaluation Patient Details Name: Johnny Wells MRN: [id number] DOB: 1945-11-15 Today's Date: 05/21/2014    History of Present Illness Weakness. Started 2 days ago. Pt slept most of the lat 2 days. Symptoms are constant and getting worse. Very little to eat or drink during this time. Last night became progressively more unresponsive. L hand and arm weakness, and LE weakness at baseline.    Clinical Impression   Pt presents to OT this morning at baseline for ADL performance and UB strength and coordination. Patient's left side weakness is baseline. Wife and niece assist patient to transfers and ADL tasks at home. I do not recommend follow up OT services as I feel it would not benefit patient due to his progressive diagnosis of Parkinson's. Recommend hoyer lift and hospital bed for home. D/C home with assist from wife and niece.    Follow Up Recommendations  No OT follow up    Equipment Recommendations  Hospital bed;Other (comment) (hoyer lift)       Precautions / Restrictions Precautions Precautions: Fall Restrictions Weight Bearing Restrictions: No      Mobility Bed Mobility Overal bed mobility: +2 for physical assistance;Needs Assistance;+ 2 for safety/equipment Bed Mobility: Rolling;Supine to Sit;Sit to Supine Rolling: Total assist;+2 for physical assistance;+2 for safety/equipment   Supine to sit: Total assist;+2 for physical assistance;+2 for safety/equipment;HOB elevated Sit to supine: HOB elevated;+2 for safety/equipment;+2 for physical assistance;Total assist      Transfers                 General transfer comment: Transfer not completed as patient did not feel safe completing this date.          ADL Overall ADL's : At baseline. Pt reports that he sponge bathes in bed.                                        General ADL Comments: Patient is a Total A for bathing and dressing which is baseline. Patient required set-up  of breakfast tray and was able to complete self-feeding.                Pertinent Vitals/Pain Pain Assessment: No/denies pain     Hand Dominance Right   Extremity/Trunk Assessment Upper Extremity Assessment Upper Extremity Assessment: Generalized weakness (Patient's RUE is functional for tasks completed. LUE has generalized weakness which is at baseline. LUE: AROM WFL, Strength is 3+/5 in all ranges. Gross grasp is weak. )   Lower Extremity Assessment Lower Extremity Assessment: Defer to PT evaluation       Communication Communication Communication: Expressive difficulties   Cognition Arousal/Alertness: Awake/alert Behavior During Therapy: WFL for tasks assessed/performed Overall Cognitive Status: Within Functional Limits for tasks assessed                                Home Living Family/patient expects to be discharged to:: Private residence Living Arrangements: Spouse/significant other (niece) Available Help at Discharge: Family Type of Home: House                       Home Equipment: Hospital bed;Wheelchair - manual;Other (comment) (slideboard)          Prior Functioning/Environment Level of Independence: Needs assistance  Gait / Transfers Assistance Needed: Patient does not ambulate and is wheelchair bound. Wife  and niece use slideboard to transfer patient to and from w/c, bed and recliner.  ADL's / Homemaking Assistance Needed: Patient is Total A for bathing and dressing. Patient states he is able to complete self-feeding.                                Co-evaluation PT/OT/SLP Co-Evaluation/Treatment: Yes Reason for Co-Treatment: Complexity of the patient's impairments (multi-system involvement);For patient/therapist safety   OT goals addressed during session: Strengthening/ROM      End of Session Equipment Utilized During Treatment: Gait belt  Activity Tolerance: Patient tolerated treatment well Patient left: in  bed;with call bell/phone within reach   Time: 0825-0910 OT Time Calculation (min): 45 min Charges:  OT General Charges $OT Visit: 1 Procedure OT Evaluation $Initial OT Evaluation Tier I: 1 Procedure G-Codes:    Limmie PatriciaLaura Zaylee Cornia, OTR/L,CBIS  [phone]  05/21/2014, 9:36 AM

## 2014-05-21 NOTE — Progress Notes (Signed)
1637 Arrived to 300 unit room 341 with family at bedside.

## 2014-05-21 NOTE — Progress Notes (Signed)
PHARMACIST - PHYSICIAN COMMUNICATION DR:   Irene LimboGoodrich CONCERNING: Antibiotic IV to Oral Route Change Policy  RECOMMENDATION: This patient is receiving Zithromax by the intravenous route.  Based on criteria approved by the Pharmacy and Therapeutics Committee, the antibiotic(s) is/are being converted to the equivalent oral dose form(s).   DESCRIPTION: These criteria include:  Patient being treated for a respiratory tract infection, urinary tract infection, cellulitis or clostridium difficile associated diarrhea if on metronidazole  The patient is not neutropenic and does not exhibit a GI malabsorption state  The patient is eating (either orally or via tube) and/or has been taking other orally administered medications for a least 24 hours  The patient is improving clinically and has a Tmax < 100.5  If you have questions about this conversion, please contact the Pharmacy Department  [x]   [phone]( [phone] )  [facility] []   [phone]( [phone] )  [facility]  []   [phone]( [phone] )  [facility] []   [phone]( [phone] )  [facility]   Junita PushMichelle Jansel Vonstein, PharmD, BCPS 05/21/2014@8 :50 AM

## 2014-05-21 NOTE — Evaluation (Signed)
Physical Therapy Evaluation Patient Details Name: Johnny Wells MRN: [id number] DOB: [birth year] Today's Date: 05/21/2014   History of Present Illness  Weakness. Started 2 days ago. Pt slept most of the lat 2 days. Symptoms are constant and getting worse. Very little to eat or drink during this time. Last night became progressively more unresponsive. L hand and arm weakness, and LE weakness at baseline.   Clinical Impression   Pt was seen for evaluation.  He was alert and oriented, very cooperative.  His wife and niece care for him.  He is essentially total assist.  Currently, he is using a sliding board for transfer bed to w/c and pivot transfer from w/c to recliner.  He will not be safe to transfer in this way due to severity of illness and I would recommend that family has a Nurse, adultHoyer lift for home use.  Pt states that his wife had been considering getting this piece of equipment.  He would like a new hospital bed..the one he has at home is quite old (his wife got it at a garage sale).    Follow Up Recommendations No PT follow up    Equipment Recommendations  Hospital bed;Other (comment) Michiel Sites(Hoyer lift)    Recommendations for Other Services   none    Precautions / Restrictions Precautions Precautions: Fall Restrictions Weight Bearing Restrictions: No      Mobility  Bed Mobility Overal bed mobility: +2 for physical assistance;Needs Assistance;+ 2 for safety/equipment Bed Mobility: Rolling;Supine to Sit;Sit to Supine Rolling: Total assist;+2 for physical assistance;+2 for safety/equipment   Supine to sit: Total assist;+2 for physical assistance;+2 for safety/equipment;HOB elevated Sit to supine: HOB elevated;+2 for safety/equipment;+2 for physical assistance;Total assist      Transfers                 General transfer comment: Transfer not completed as patient did not feel safe completing this date.   Ambulation/Gait                Stairs             Wheelchair Mobility    Modified Rankin (Stroke Patients Only)       Balance Overall balance assessment: Needs assistance Sitting-balance support: Bilateral upper extremity supported;Feet supported Sitting balance-Leahy Scale: Fair         Standing balance comment: pt unable to stand                             Pertinent Vitals/Pain Pain Assessment: No/denies pain    Home Living Family/patient expects to be discharged to:: Private residence Living Arrangements: Spouse/significant other Available Help at Discharge: Family Type of Home: House       Home Layout: One level Home Equipment: Wheelchair - Proofreadermanual;Hospital bed;Walker - 2 wheels Additional Comments: Pt states that his hospital bed is very old and he needs a newer one    Prior Function Level of Independence: Needs assistance   Gait / Transfers Assistance Needed: Patient does not ambulate and is wheelchair bound. Wife and niece use slideboard to transfer patient to and from w/c, bed and recliner.   ADL's / Homemaking Assistance Needed: Patient is Total A for bathing and dressing. Patient states he is able to complete self-feeding.         Hand Dominance   Dominant Hand: Right    Extremity/Trunk Assessment   Upper Extremity Assessment: Defer to OT evaluation  Lower Extremity Assessment: Generalized weakness (strength generally 2/5 and equal left to right)         Communication   Communication: Expressive difficulties (secondary to Parkinsons)  Cognition Arousal/Alertness: Awake/alert Behavior During Therapy: WFL for tasks assessed/performed Overall Cognitive Status: Within Functional Limits for tasks assessed                      General Comments      Exercises        Assessment/Plan    PT Assessment Patent does not need any further PT services  PT Diagnosis     PT Problem List    PT Treatment Interventions     PT Goals (Current goals can be found in  the Care Plan section) Acute Rehab PT Goals PT Goal Formulation: All assessment and education complete, DC therapy    Frequency     Barriers to discharge  none      Co-evaluation PT/OT/SLP Co-Evaluation/Treatment: Yes Reason for Co-Treatment: Complexity of the patient's impairments (multi-system involvement);For patient/therapist safety PT goals addressed during session: Mobility/safety with mobility OT goals addressed during session: Strengthening/ROM       End of Session Equipment Utilized During Treatment: Gait belt Activity Tolerance: Patient tolerated treatment well Patient left: in bed;with call bell/phone within reach;with bed alarm set Nurse Communication: Mobility status;Need for lift equipment         Time: 0826-0911 PT Time Calculation (min) (ACUTE ONLY): 45 min   Charges:   PT Evaluation $Initial PT Evaluation Tier I: 1 Procedure     PT G CodesKonrad Penta 05/21/2014, 10:23 AM

## 2014-05-21 NOTE — Progress Notes (Signed)
Patient ID: DEION SWIFT, male   DOB: 1945-12-04, 68 y.o.   MRN: [id number]  [place] A. Merlene Laughter, MD     [email or web address]          Ludger L Kroner is an 69 y.o. male.   Assessment/Plan: 1. Acute encephalopathy due to acute infectious processes and over from acute Pylonephritis and the community-acquired pneumonia. I suspect that the patient's cognition should continue to improve and he should return to baseline. 2. Severe baseline gait impairment. This is most likely multifactorial including severe axial parkinsonism and severe osteoarthritis. 3. 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 Unfortunately, most of the patient's current debilitating symptoms are due to axial Parkinson symptoms which are notoriously extremely difficult to treat with current available therapies. Physical therapy may be beneficial. We will continue with the his medications at the current doses. The patient's appendicular symptoms are well treated with the current regimen. 4. Subacute right temporal infarct unclear etiology. The patient already is on anticoagulation. I would suggest continuing with this therapy. Adding an antiplatelet agents probably is not as beneficial at this point in time. I suspect that this will increase the risk of hemorrhage without additional benefit. We will follow up the echocardiography. 5. It appears to be some episodes of unresponsiveness this is concerning for possible complex partial seizures especially as a sequelae of the patient's right temporal infarct. EEG is suggested. ( Shows marked slowing But no epileptiform activity) 6. 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  No changes   GENERAL: Patient is in no acute distress. He is asking to go home.  HEENT: Supple. Atraumatic normocephalic.   ABDOMEN: soft  EXTREMITIES: No edema. Severe arthritic changes especially of the knees bilaterally.   BACK:  Normal.  SKIN: Normal by inspection.   MENTAL STATUS: Drowsy today. Eyes closed. Opens with sternal rub. Still the patient has severe dysarthria mostly due to severe hypophonia.  CRANIAL NERVES: Pupils are equal, round and reactive to light and accommodation; extra ocular movements are full, there is no significant nystagmus; visual fields are full; upper and lower facial muscles are normal in strength and symmetric, there is no flattening of the nasolabial folds; tongue is midline; uvula is midline; shoulder elevation is normal.  MOTOR: Normal tone, bulk and strength; no pronator drift.  COORDINATION: Left finger to nose is normal, right finger to nose is normal, No rest tremor; no intention tremor; no postural tremor. Mild to moderate impairment of hand dexterity.  SENSATION: Limited due to the impaired cognition but appears respond to painful some light bilaterally.     Objective: Vital signs in last 24 hours: Temp:  [97.5 F (36.4 C)-97.9 F (36.6 C)] 97.6 F (36.4 C) (02/19 1100) Pulse Rate:  [25-75] 25 (02/19 0800) Resp:  [15-26] 19 (02/19 1544) BP: (100-138)/(63-80) 112/77 mmHg (02/19 1544) SpO2:  [86 %-98 %] 86 % (02/19 0800) Weight:  [125.5 kg (276 lb 10.8 oz)-127.869 kg (281 lb 14.4 oz)] 127.869 kg (281 lb 14.4 oz) (02/19 1544)  Intake/Output from previous day: 02/18 0701 - 02/19 0700 In: 3084.2 [P.O.:120; I.V.:1964.2; IV Piggyback:1000] Out: 1800 [Urine:1800] Intake/Output this shift:   Nutritional status: DIET DYS 3   Lab Results: Results for orders placed or performed during the hospital encounter of 05/19/14 (from the past 48 hour(s))  CBC     Status: Abnormal   Collection Time: 05/20/14  4:39 AM  Result Value Ref Range   WBC 6.3 4.0 - 10.5  K/uL   RBC 4.79 4.22 - 5.81 MIL/uL   Hemoglobin 15.6 13.0 - 17.0 g/dL   HCT 83.6 49.4 - 02.7 %   MCV 92.5 78.0 - 100.0 fL   MCH 32.6 26.0 - 34.0 pg   MCHC 35.2 30.0 - 36.0 g/dL   RDW 00.4 32.9 - 71.0 %   Platelets  134 (L) 150 - 400 K/uL  Comprehensive metabolic panel     Status: Abnormal   Collection Time: 05/20/14  4:39 AM  Result Value Ref Range   Sodium 136 135 - 145 mmol/L   Potassium 3.7 3.5 - 5.1 mmol/L   Chloride 105 96 - 112 mmol/L   CO2 26 19 - 32 mmol/L   Glucose, Bld 85 70 - 99 mg/dL   BUN 20 6 - 23 mg/dL   Creatinine, Ser 2.46 0.50 - 1.35 mg/dL   Calcium 9.2 8.4 - 83.8 mg/dL   Total Protein 6.2 6.0 - 8.3 g/dL   Albumin 3.5 3.5 - 5.2 g/dL   AST 20 0 - 37 U/L   ALT 15 0 - 53 U/L   Alkaline Phosphatase 46 39 - 117 U/L   Total Bilirubin 1.8 (H) 0.3 - 1.2 mg/dL   GFR calc non Af Amer 74 (L) >90 mL/min   GFR calc Af Amer 86 (L) >90 mL/min    Comment: (NOTE) The eGFR has been calculated using the CKD EPI equation. This calculation has not been validated in all clinical situations. eGFR's persistently <90 mL/min signify possible Chronic Kidney Disease.    Anion gap 5 5 - 15  Legionella antigen, urine     Status: None   Collection Time: 05/20/14  9:50 AM  Result Value Ref Range   Specimen Description URINE, CATHETERIZED    Special Requests NONE    Legionella Antigen, Urine      Negative for Legionella pneumophila serogroup 1                                                              Legionella pneumophila serogroup 1 antigen can be detected in urine within 2 to 3 days of infection and may persist even after treatment. This  assay does not detect other Legionella species or serogroups. Performed at [facility]    Report Status 05/21/2014 FINAL   Strep pneumoniae urinary antigen     Status: None   Collection Time: 05/20/14  9:50 AM  Result Value Ref Range   Strep Pneumo Urinary Antigen NEGATIVE NEGATIVE    Comment:        Infection due to S. pneumoniae cannot be absolutely ruled out since the antigen present may be below the detection limit of the test. Performed at [facility]   Gram stain     Status: None   Collection Time: 05/21/14 12:07 AM  Result  Value Ref Range   Specimen Description TRACHEAL SITE    Special Requests NONE    Gram Stain      ABUNDANT YEAST ABUNDANT GRAM POSITIVE COCCI ABUNDANT GRAM POSITIVE RODS RARE GRAM NEGATIVE RODS RARE GRAM NEGATIVE COCCI    Report Status 05/21/2014 FINAL   Lipid panel     Status: Abnormal   Collection Time: 05/21/14  4:11 AM  Result Value Ref Range   Cholesterol 162  0 - 200 mg/dL   Triglycerides 125 <150 mg/dL   HDL 22 (L) >39 mg/dL   Total CHOL/HDL Ratio 7.4 RATIO   VLDL 25 0 - 40 mg/dL   LDL Cholesterol 115 (H) 0 - 99 mg/dL    Comment:        Total Cholesterol/HDL:CHD Risk Coronary Heart Disease Risk Table                     Men   Women  1/2 Average Risk   3.4   3.3  Average Risk       5.0   4.4  2 X Average Risk   9.6   7.1  3 X Average Risk  23.4   11.0        Use the calculated Patient Ratio above and the CHD Risk Table to determine the patient's CHD Risk.        ATP III CLASSIFICATION (LDL):  <100     mg/dL   Optimal  100-129  mg/dL   Near or Above                    Optimal  130-159  mg/dL   Borderline  160-189  mg/dL   High  >190     mg/dL   Very High   TSH     Status: None   Collection Time: 05/21/14  2:44 PM  Result Value Ref Range   TSH 2.419 0.350 - 4.500 uIU/mL    Lipid Panel  Recent Labs  05/21/14 0411  CHOL 162  TRIG 125  HDL 22*  CHOLHDL 7.4  VLDL 25  LDLCALC 115*    Studies/Results:   Medications:  Scheduled Meds: . azithromycin  500 mg Oral QHS  . carbidopa-levodopa  2 tablet Oral QID  . cefTRIAXone (ROCEPHIN)  IV  1 g Intravenous Q24H  . entacapone  200 mg Oral QID  . rivaroxaban  20 mg Oral Q supper  . sodium chloride  3 mL Intravenous Q12H  . vancomycin  1,000 mg Intravenous Q12H   Continuous Infusions:  PRN Meds:.acetaminophen **OR** acetaminophen, ondansetron **OR** ondansetron (ZOFRAN) IV     LOS: 2 days   Markala Sitts A. Merlene Laughter, M.D.  Diplomate, Tax adviser of Psychiatry and Neurology ( Neurology).

## 2014-05-22 LAB — CULTURE, BLOOD (ROUTINE X 2)

## 2014-05-22 LAB — CBC
HEMATOCRIT: 41.8 % (ref 39.0–52.0)
HEMOGLOBIN: 14.6 g/dL (ref 13.0–17.0)
MCH: 32.2 pg (ref 26.0–34.0)
MCHC: 34.9 g/dL (ref 30.0–36.0)
MCV: 92.3 fL (ref 78.0–100.0)
PLATELETS: 134 10*3/uL — AB (ref 150–400)
RBC: 4.53 MIL/uL (ref 4.22–5.81)
RDW: 13.1 % (ref 11.5–15.5)
WBC: 5.9 10*3/uL (ref 4.0–10.5)

## 2014-05-22 LAB — HEMOGLOBIN A1C
HEMOGLOBIN A1C: 5 % (ref 4.8–5.6)
Mean Plasma Glucose: 97 mg/dL

## 2014-05-22 LAB — BASIC METABOLIC PANEL
Anion gap: 7 (ref 5–15)
BUN: 18 mg/dL (ref 6–23)
CHLORIDE: 108 mmol/L (ref 96–112)
CO2: 24 mmol/L (ref 19–32)
Calcium: 9.4 mg/dL (ref 8.4–10.5)
Creatinine, Ser: 1.01 mg/dL (ref 0.50–1.35)
GFR calc Af Amer: 86 mL/min — ABNORMAL LOW (ref >90)
GFR, EST NON AFRICAN AMERICAN: 74 mL/min — AB (ref >90)
GLUCOSE: 110 mg/dL — AB (ref 70–99)
POTASSIUM: 3.7 mmol/L (ref 3.5–5.1)
Sodium: 139 mmol/L (ref 135–145)

## 2014-05-22 LAB — VITAMIN B12: Vitamin B-12: 251 pg/mL (ref 211–911)

## 2014-05-22 NOTE — Plan of Care (Signed)
Problem: Acute Treatment Outcomes Goal: Neuro exam at baseline or improved Outcome: Progressing Patient eyes opened, alert and oriented to self and place. Took pills whole with applesauce and water.

## 2014-05-22 NOTE — Progress Notes (Addendum)
PROGRESS NOTE  Johnny Wells [id number] DOB: 1946-03-14 DOA: 05/19/2014 PCP: Johnny Wells  Summary: 69 year old male with history of Parkinson's disease and baseline left upper and left lower extremity weakness presented with 48 hours of generalized weakness, anorexia, lethargy. Admitted for early sepsis, CAP, UTI, subacute infarct right temporal lobe.  Assessment/Plan: 1. Possible early sepsis on admission. Resolved. Lactic acid, pro-calcitonin normal.  2. CAP with fever and hypoxemia on ABG. Afebrile 48 hours. Improving rapidly. 3. GPC in clusters 1/2 blood cultures. Suspect contaminant but continue vancomycin until results available, expect tomorrow. 4. UTI. Culture unrevealing, >100k, multiple bacterial morphotypes. 5. Acute encephalopathy, multifactorial including pneumonia, UTI, subacute stroke. Resolved.  6. Subacute infarct right temporal lobe. No MRI with deep brain stimulator. No new focal deficits apparent. Per neurology continue anticoagulation, do not add anti-platelet therapy. Echocardiogram and bilateral carotid dopplers unremarkable 7. Parkinson's disease, with associated severe gait impairment. H/o deep brain stimulator placement. On Sinemet, entacapone. 8. LUE/LLE weakness at baseline complication of deep brain stimulator. 9. Chronic thrombocytopenia of unclear significance. At baseline. 10. 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   Overall improving rapidly, plan change to oral abx 2/21  Home 2/21 if blood culture spurious  Hospital bed recommended  Code Status: full code DVT prophylaxis: Xarelto Family Communication: niece present; discussed with wife by telephone Disposition Plan: home with home health RN  Johnny Sacksaniel Lehman Whiteley, Wells  [facility]  Pager [phone] If 7PM-7AM, please contact night-coverage at [email or web address], password [email or web address] 05/22/2014, 2:23 PM  LOS: 3 days   Consultants:  Neurology  ST: dysphagia 3, thin liquid  Physical therapy: No  follow-up.  Procedures:  2-D echocardiogram Study Conclusions  - Procedure narrative: Transthoracic echocardiography. Image quality was suboptimal, with poor right-sided chamber and valvular visualization. - Left ventricle: The cavity size was normal. There was mild concentric hypertrophy. Systolic function was normal. The estimated ejection fraction was in the range of 60% to 65%. Wall motion was normal; there were no regional wall motion abnormalities. Findings consistent with left ventricular diastolic dysfunction. - Aortic valve: There was mild regurgitation. - Mitral valve: Mildly calcified annulus. Mildly thickened leaflets . There was mild regurgitation. - Atrial septum: Poorly visualized.  EEG IMPRESSION: This recording is abnormal showing a moderately severe global generalized slowing indicated a moderate to severe generalized encephalopathy. However, there is no epileptiform activity observed.  Antibiotics:  Ceftriaxone 2/18 >>  Azithromycin 2/18 >>  Vancomycin 2/18 >>  Levaquin 2/17  HPI/Subjective: Physical therapy recommended no follow-up.  No complaints, eating ok. Breathing fine, no SOB, some cough.  Objective: Filed Vitals:   05/21/14 1100 05/21/14 1544 05/21/14 2233 05/22/14 0648  BP:  112/77 113/61 114/72  Pulse:   41 63  Temp: 97.6 F (36.4 C)  97.4 F (36.3 C) 98.2 F (36.8 C)  TempSrc: Oral  Oral Oral  Resp:  19 17 15   Height:  6\' 3"  (1.905 m)    Weight:  127.869 kg (281 lb 14.4 oz)    SpO2:   95% 97%    Intake/Output Summary (Last 24 hours) at 05/22/14 1423 Last data filed at 05/21/14 2100  Gross per 24 hour  Intake    370 ml  Output      0 ml  Net    370 ml     Filed Weights   05/20/14 0500 05/21/14 0500 05/21/14 1544  Weight: 126.2 kg (278 lb 3.5 oz) 125.5 kg (276 lb 10.8 oz) 127.869 kg (281 lb 14.4 oz)    Exam:  Afebrile, vital signs stable. No hypoxia. General:  Appears comfortable,  calm. Cardiovascular: Regular rate and rhythm with premature beats, no murmur, rub or gallop. 1+ bilateral lower extremity edema. Respiratory: Clear to auscultation bilaterally, no wheezes, rales or rhonchi. Normal respiratory effort. Psychiatric: grossly normal mood and affect, speech fluent and appropriate  Data Reviewed:  Basic metabolic panel unremarkable  CBC unremarkable, platelet count stable 134  Pertinent data:  Labs  Chronic thrombocytopenia at baseline (seen 2012)  Imaging   Chest x-ray: Focal infiltrate left base  CT head: Subacute infarct right medial temporal lobe   Bilateral carotid u/s with significant obstruction. Other    Pending data:  Blood cultures  Urine culture  Scheduled Meds: . azithromycin  500 mg Oral QHS  . carbidopa-levodopa  2 tablet Oral QID  . cefTRIAXone (ROCEPHIN)  IV  1 g Intravenous Q24H  . entacapone  200 mg Oral QID  . rivaroxaban  20 mg Oral Q supper  . sodium chloride  3 mL Intravenous Q12H  . vancomycin  1,000 mg Intravenous Q12H   Continuous Infusions:    Principal Problem:   Sepsis Active Problems:   CAP (community acquired pneumonia)   Acute encephalopathy   Generalized weakness   Urinary tract infection   Parkinsonism   Stroke   Time spent 20 minutes

## 2014-05-23 LAB — HOMOCYSTEINE: Homocysteine: 22.2 umol/L — ABNORMAL HIGH (ref 0.0–15.0)

## 2014-05-23 MED ORDER — CEFUROXIME AXETIL 250 MG PO TABS
500.0000 mg | ORAL_TABLET | Freq: Two times a day (BID) | ORAL | Status: DC
  Administered 2014-05-24 (×2): 500 mg via ORAL
  Filled 2014-05-23 (×2): qty 2

## 2014-05-23 MED ORDER — CETYLPYRIDINIUM CHLORIDE 0.05 % MT LIQD
7.0000 mL | Freq: Two times a day (BID) | OROMUCOSAL | Status: DC
  Administered 2014-05-23 – 2014-05-24 (×3): 7 mL via OROMUCOSAL

## 2014-05-23 NOTE — Progress Notes (Signed)
PROGRESS NOTE  Johnny Wells NWG:[id number] DOB: 1945/12/07 DOA: 05/19/2014 PCP: Kirstie Peri, MD  Summary: 69 year old male with history of Parkinson's disease and baseline left upper and left lower extremity weakness presented with 48 hours of generalized weakness, anorexia, lethargy. Admitted for early sepsis, CAP, UTI, subacute infarct right temporal lobe.  Assessment/Plan: 1. Possible early sepsis on admission. Resolved. Lactic acid, pro-calcitonin normal. Hemodynamics stable. 2. CAP with fever and hypoxemia on ABG on admission. Afebrile 72 hours. No hypoxia, respiratory status stable. Upper airway noise, no hypoxia. 3. GPC in clusters 1/2 blood cultures. Coag-negative Staph--contaminant. Stop vancomycin. 4. UTI. Culture unrevealing, >100k, multiple bacterial morphotypes. 5. Acute encephalopathy, multifactorial including pneumonia, UTI, subacute stroke. Resolved. Appears to be at baseline. 6. Subacute infarct right temporal lobe. No MRI with deep brain stimulator. No new focal deficits apparent. Per neurology continue anticoagulation, do not add anti-platelet therapy. Echocardiogram and bilateral carotid dopplers unremarkable 7. Parkinson's disease, with associated severe gait impairment. H/o deep brain stimulator placement. On Sinemet, entacapone. 8. LUE/LLE weakness at baseline, complication of deep brain stimulator. 9. Chronic thrombocytopenia of unclear significance. At baseline. Follow-up as an outpatient. 10. 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  Code Status: full code DVT prophylaxis: Xarelto Family Communication:  Disposition Plan: home with home health RN  Brendia Sacks, MD  [facility]  Pager [phone] If 7PM-7AM, please contact night-coverage at [email or web address], password [facility] 05/23/2014, 10:02 AM  LOS: 4 days    Consultants:  Neurology  ST: dysphagia 3, thin liquid  Physical therapy: No follow-up.  Procedures:  2-D echocardiogram Study Conclusions  - Procedure narrative: Transthoracic echocardiography. Image quality was suboptimal, with poor right-sided chamber and valvular visualization. - Left ventricle: The cavity size was normal. There was mild concentric hypertrophy. Systolic function was normal. The estimated ejection fraction was in the range of 60% to 65%. Wall motion was normal; there were no regional wall motion abnormalities. Findings consistent with left ventricular diastolic dysfunction. - Aortic valve: There was mild regurgitation. - Mitral valve: Mildly calcified annulus. Mildly thickened leaflets . There was mild regurgitation. - Atrial septum: Poorly visualized.  EEG IMPRESSION: This recording is abnormal showing a moderately severe global generalized slowing indicated a moderate to severe generalized encephalopathy. However, there is no epileptiform activity observed.  Antibiotics:  Ceftriaxone 2/18 >> 2/21  Ceftin 2/22 >> 2/24  Azithromycin 2/18 >> 2/22  Vancomycin 2/18 >>  Levaquin 2/17  HPI/Subjective: Somnolent this AM per nursing.  Patient denies pain, complaints, breathing fine.   Objective: Filed Vitals:   05/22/14 0648 05/22/14 1455 05/23/14 0016 05/23/14 0614  BP: 114/72 114/57 140/57 122/65  Pulse: 63 75 41 72  Temp: 98.2 F (36.8 C) 98.4 F (36.9 C) 98.5 F (36.9 C) 98.7 F (37.1 C)  TempSrc: Oral Oral Oral Oral  Resp: Height:      Weight:      SpO2: 97% 97% 94% 95%    Intake/Output Summary (Last 24 hours) at 05/23/14 1002 Last data filed at 05/23/14 1002  Gross per 24 hour  Intake    243 ml  Output   1450 ml  Net  -1207 ml     Filed Weights   05/20/14 0500 05/21/14 0500 05/21/14 1544  Weight: 126.2 kg (278 lb 3.5 oz) 125.5 kg (276 lb 10.8 oz) 127.869 kg (281 lb 14.4 oz)  Exam:      Afebrile, VSS. No hypoxia. General: Appears calm and comfortable, awakens easily to voice and follows commands; hypophonia noted. Cardiovascular: RRR, no m/r/g. No LE edema. Respiratory: CTA bilaterally, no w/r/r. Normal respiratory effort. Upper airway noise. Abdomen: soft, ntnd Skin: no rash or induration seen  Musculoskeletal: moves all extremities to command; globally weak Psychiatric: grossly normal mood and affect  Data Reviewed:  UOP 1450  Pertinent data:  Labs  Chronic thrombocytopenia at baseline (seen 2012)  Imaging   Chest x-ray: Focal infiltrate left base  CT head: Subacute infarct right medial temporal lobe   Bilateral carotid u/s with significant obstruction. Other  UC multiple morphotypes, non predominant.  BC 1/2 STAPHYLOCOCCUS SPECIES (COAGULASE NEGATIVE)  No culture was obtained on sputum  Pending data:  Blood culture 1/2 NGTD  Scheduled Meds: . azithromycin  500 mg Oral QHS  . carbidopa-levodopa  2 tablet Oral QID  . cefTRIAXone (ROCEPHIN)  IV  1 g Intravenous Q24H  . entacapone  200 mg Oral QID  . rivaroxaban  20 mg Oral Q supper  . sodium chloride  3 mL Intravenous Q12H  . vancomycin  1,000 mg Intravenous Q12H   Continuous Infusions:    Principal Problem:   Sepsis Active Problems:   CAP (community acquired pneumonia)   Acute encephalopathy   Generalized weakness   Urinary tract infection   Parkinsonism   Stroke   Time spent 20 minutes

## 2014-05-24 LAB — CULTURE, BLOOD (ROUTINE X 2): Culture: NO GROWTH

## 2014-05-24 MED ORDER — CEFUROXIME AXETIL 500 MG PO TABS: 500.0000 mg | ORAL_TABLET | Freq: Two times a day (BID) | ORAL | Status: AC

## 2014-05-24 NOTE — Progress Notes (Signed)
1425 Patient d/c home this shift transferred home via EMS transport. IV catheters removed from patient's RIGHT forearm and RIGHT AC, catheter tips intact, no s/s of infection noted. Patient tolerated well. D/C paperwork and hard Rx given to EMS to give to patient's wife. Patient's wife called by this Clinical research associatewriter and made aware that the patient is in route home at this time.

## 2014-05-24 NOTE — Progress Notes (Signed)
Patient ID: Kerri PerchesDarryl L Welshans, male   DOB: [birth year], 69 y.o.   MRN: [id number]  [facility] [place] T[place] A. Gerilyn Pilgrimoonquah, MD     [email or web address]          Tyrell L Gallego is an 69 y.o. male.   Assessment/Plan: 1. Acute encephalopathy due to acute infectious processes and over from acute Pylonephritis and the community-acquired pneumonia. I suspect that the patient's cognition should continue to improve and he should return to baseline. Some fluctuation.  2. Severe baseline gait impairment. This is most likely multifactorial including severe axial parkinsonism and severe osteoarthritis. 3. 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 Unfortunately, most of the patient's current debilitating symptoms are due to axial Parkinson symptoms which are notoriously extremely difficult to treat with current available therapies. Physical therapy may be beneficial. We will continue with the his medications at the current doses. The patient's appendicular symptoms are well treated with the current regimen. 4. Subacute right temporal infarct unclear etiology. The patient already is on anticoagulation. I would suggest continuing with this therapy. Adding an antiplatelet agents probably is not as beneficial at this point in time. I suspect that this will increase the risk of hemorrhage without additional benefit.  5. 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 6. Parkinson dementia.  7. Suspect sleep apnea. Trial of nocturnal CPAP.    Drowsy this am  GENERAL: Patient is in no acute distress. He is asking to go home.  HEENT: Supple. Atraumatic normocephalic.   ABDOMEN: soft  EXTREMITIES: No edema. Severe arthritic changes especially of the knees bilaterally.   BACK: Normal.  SKIN: Normal by inspection.   MENTAL STATUS: Quite drowsy today. Eyes closed. Opens with sternal rub. Briefly follows commands on R.  CRANIAL NERVES: Pupils are equal, round  and reactive to light and accommodation; extra ocular movements are full, there is no significant nystagmus; visual fields are full; upper and lower facial muscles are normal in strength and symmetric, there is no flattening of the nasolabial folds; tongue is midline; uvula is midline; shoulder elevation is normal.  MOTOR: Normal tone, bulk and strength; no pronator drift.  COORDINATION: Left finger to nose is normal, right finger to nose is normal, No rest tremor; no intention tremor; no postural tremor. Mild to moderate impairment of hand dexterity.  SENSATION: Limited due to the impaired cognition but appears respond to painful some light bilaterally.     Objective: Vital signs in last 24 hours: Temp:  [97.7 F (36.5 C)-98.7 F (37.1 C)] 98.3 F (36.8 C) (02/22 0814) Pulse Rate:  [40-84] 40 (02/22 0814) Resp:  [14-20] 14 (02/22 0814) BP: (127-151)/(58-86) 128/58 mmHg (02/22 0814) SpO2:  [93 %-97 %] 93 % (02/22 0441)  Intake/Output from previous day: 02/21 0701 - 02/22 0700 In: 103 [P.O.:100; I.V.:3] Out: 700 [Urine:700] Intake/Output this shift:   Nutritional status: DIET DYS 3   Lab Results: No results found for this or any previous visit (from the past 48 hour(s)).  Lipid Panel No results for input(s): CHOL, TRIG, HDL, CHOLHDL, VLDL, LDLCALC in the last 72 hours.  Studies/Results: ECHO Left ventricle: The cavity size was normal. There was mild concentric hypertrophy. Systolic function was normal. The estimated ejection fraction was in the range of 60% to 65%. Wall motion was normal; there were no regional wall motion abnormalities. Findings consistent with left ventricular diastolic dysfunction.  Medications:  Scheduled Meds: . antiseptic oral rinse  7 mL Mouth Rinse BID  . azithromycin  500 mg Oral QHS  . carbidopa-levodopa  2 tablet Oral QID  . cefUROXime  500 mg Oral BID WC  . entacapone  200 mg Oral QID  . rivaroxaban  20 mg Oral Q supper  . sodium  chloride  3 mL Intravenous Q12H   Continuous Infusions:  PRN Meds:.acetaminophen **OR** acetaminophen, ondansetron **OR** ondansetron (ZOFRAN) IV     LOS: 5 days   Jules Vidovich A. Gerilyn Pilgrim, M.D.  Diplomate, Biomedical engineer of Psychiatry and Neurology ( Neurology).

## 2014-05-24 NOTE — Progress Notes (Signed)
PROGRESS NOTE  Johnny Wells ZOX:[id number] DOB: Sep 09, 1945 DOA: 05/19/2014 PCP: Kirstie Peri, MD  Summary: 69 year old male with history of Parkinson's disease and baseline left upper and left lower extremity weakness presented with 48 hours of generalized weakness, anorexia, lethargy. Admitted for early sepsis, CAP, UTI, subacute infarct right temporal lobe.  Assessment/Plan: 1. Possible early sepsis on admission. Resolved. Lactic acid, pro-calcitonin normal. Hemodynamics remain stable. 2. CAP with fever and hypoxemia on ABG on admission. Afebrile 96 hours. No hypoxia, respiratory status stable.  3. GPC in clusters 1/2 blood cultures. Coag-negative Staph--contaminant.  4. UTI. Culture unrevealing, >100k, multiple bacterial morphotypes. Treated empirically. 5. Acute encephalopathy, multifactorial including pneumonia, UTI, subacute stroke. Resolved. At baseline. 6. Subacute infarct right temporal lobe. No MRI with deep brain stimulator. No new focal deficits apparent. Per neurology continue anticoagulation, do not add anti-platelet therapy. Echocardiogram and bilateral carotid dopplers unremarkable. LDL 115. 7. Parkinson's disease, with associated severe gait impairment. H/o deep brain stimulator placement. On Sinemet, entacapone. 8. LUE/LLE weakness at baseline, complication of deep brain stimulator. 9. Chronic thrombocytopenia of unclear significance. Stable. Follow-up as an outpatient. 10. 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   Doing well, plan home today, finish abx  Add statin  Hospital bed recommended  Consider outpatient sleep study  Code Status: full code DVT prophylaxis: Xarelto Family Communication:  Disposition Plan: home with home health RN  Brendia Sacks, MD  [facility]  Pager [phone] If 7PM-7AM, please contact night-coverage at [email or web address], password [facility] 05/24/2014, 12:30 PM  LOS: 5 days   Consultants:  Neurology  ST: dysphagia 3, thin  liquid  Physical therapy: No follow-up.  Procedures:  2-D echocardiogram Study Conclusions  - Procedure narrative: Transthoracic echocardiography. Image quality was suboptimal, with poor right-sided chamber and valvular visualization. - Left ventricle: The cavity size was normal. There was mild concentric hypertrophy. Systolic function was normal. The estimated ejection fraction was in the range of 60% to 65%. Wall motion was normal; there were no regional wall motion abnormalities. Findings consistent with left ventricular diastolic dysfunction. - Aortic valve: There was mild regurgitation. - Mitral valve: Mildly calcified annulus. Mildly thickened leaflets . There was mild regurgitation. - Atrial septum: Poorly visualized.  EEG IMPRESSION: This recording is abnormal showing a moderately severe global generalized slowing indicated a moderate to severe generalized encephalopathy. However, there is no epileptiform activity observed.  Antibiotics:  Ceftriaxone 2/18 >> 2/21  Ceftin 2/22 >> 2/24  Azithromycin 2/18 >> 2/22 PM  Vancomycin 2/18 >> 2/22  Levaquin 2/17 >> 2/19  HPI/Subjective: "I think I'm in good shape". Eating ok, no pain, breathing fine.  Objective: Filed Vitals:   05/23/14 1501 05/23/14 2149 05/24/14 0441 05/24/14 0814  BP: 127/77 134/73 151/86 128/58  Pulse: 84 76 80 40  Temp: 97.7 F (36.5 C) 98.7 F (37.1 C) 98.1 F (36.7 C) 98.3 F (36.8 C)  TempSrc: Oral Oral Oral Oral  Resp: Height:      Weight:      SpO2: 96% 97% 93%     Intake/Output Summary (Last 24 hours) at 05/24/14 1230 Last data filed at 05/24/14 1000  Gross per 24 hour  Intake    220 ml  Output    450 ml  Net   -230 ml     Filed Weights   05/20/14 0500 05/21/14 0500 05/21/14 1544  Weight: 126.2 kg (278 lb 3.5 oz) 125.5 kg (276 lb 10.8 oz) 127.869 kg (281 lb 14.4 oz)    Exam:  Afebrile, VSS. No hypoxia. General:  Appears comfortable,  calm. Very alert. Cardiovascular: Regular rate and rhythm, no murmur, rub or gallop. No lower extremity edema. Respiratory: Clear to auscultation bilaterally, no wheezes, rales or rhonchi. Normal respiratory effort. Abdomen: soft, ntnd Musculoskeletal: moves all extremities to command Psychiatric: grossly normal mood and affect, speech fluent and appropriate Neurologic: grossly unchanged  Data Reviewed:  UOP 1450; weights unreliable; I/O balanced since admission  Pertinent data:  Labs  Chronic thrombocytopenia at baseline (seen 2012)  Imaging   Chest x-ray: Focal infiltrate left base  CT head: Subacute infarct right medial temporal lobe   Bilateral carotid u/s with significant obstruction. Other  UC multiple morphotypes, non predominant.  BC 1/2 STAPHYLOCOCCUS SPECIES (COAGULASE NEGATIVE)  No culture was obtained on sputum  Pending data:  Blood culture 1/2 NGTD  Scheduled Meds: . antiseptic oral rinse  7 mL Mouth Rinse BID  . azithromycin  500 mg Oral QHS  . carbidopa-levodopa  2 tablet Oral QID  . cefUROXime  500 mg Oral BID WC  . entacapone  200 mg Oral QID  . rivaroxaban  20 mg Oral Q supper  . sodium chloride  3 mL Intravenous Q12H   Continuous Infusions:    Principal Problem:   Sepsis Active Problems:   CAP (community acquired pneumonia)   Acute encephalopathy   Generalized weakness   Urinary tract infection   Parkinsonism   Stroke

## 2014-05-24 NOTE — Clinical Social Work Note (Signed)
CSW arranged transport via [facility] at wife's request for later this afternoon. Verified address and that family would be present upon arrival.   Derenda FennelKara Christl Fessenden, [place] [phone]

## 2014-05-24 NOTE — Discharge Summary (Signed)
Physician Discharge Summary  Johnny Wells ZOX:[id number] DOB: 1945-10-20 DOA: 05/19/2014  PCP: Kirstie Peri, MD  Admit date: 05/19/2014 Discharge date: 05/24/2014  Recommendations for Outpatient Follow-up:  1. Ongoing treatment for Parkinson's disease 2. Resolution of pneumonia  3. Home health RN  4. Consider outpatient sleep study    Follow-up Information    Follow up with [facility].   Contact information:   [address] [place] [address] [phone]       Follow up with [facility], [place]. Schedule an appointment as soon as possible for a visit in 2 weeks.   Specialty:  Internal Medicine   Contact information:   [address]  [place] [address] [phone]      Discharge Diagnoses:  1. Community acquired pneumonia 2. Possible sepsis on admission secondary to pneumonia 3. Hypoxemia, acute 4. UTI 5. Acute metabolic encephalopathy 6. Subacute ischemic infarct right temporal lobe 7. Parkinson's disease with associated severe gait impairment 8. Chronic thrombocytopenia 9. Tobacco dependence in remission  Discharge Condition:  Improved  Disposition: home with [facility] RN  Diet recommendation: dysphagia 3 diet, thin liquid   Filed Weights   05/20/14 0500 05/21/14 0500 05/21/14 1544  Weight: 126.2 kg (278 lb 3.5 oz) 125.5 kg (276 lb 10.8 oz) 127.869 kg (281 lb 14.4 oz)    History of present illness:  69 year old male with history of Parkinson's disease and baseline left upper and left lower extremity weakness presented with 48 hours of generalized weakness, anorexia, lethargy. Admitted for early sepsis, CAP, UTI, subacute infarct right temporal lobe.  Hospital Course:  Mr. Pichon was treated empirically for pneumonia and early sepsis with rapid resolution. UTI was also empirically treated. Acute encephalopathy rapidly improved with treatment of underlying acute illness. Subacute infarct right temporal lobe was seen, patient was seen in  consultation with neurology, recommendations were to continue anticoagulation, do not add antiplatelet therapy. Remainder of workup was unremarkable. He was not a candidate for TPA secondary to unknown onset, delay in presentation. Hospitalization was uncomplicated. Individual issues as below.    Possible early sepsis on admission. Resolved. Lactic acid, pro-calcitonin normal. Hemodynamics remain stable.  CAP with fever and hypoxemia on ABG on admission. Afebrile 96 hours. No hypoxia, respiratory status stable.   GPC in clusters 1/2 blood cultures. Coag-negative Staph--contaminant.   UTI. Culture unrevealing, >100k, multiple bacterial morphotypes. Treated empirically.  Acute encephalopathy, multifactorial including pneumonia, UTI, subacute stroke. Resolved. At baseline.  Subacute infarct right temporal lobe. No MRI with deep brain stimulator. No new focal deficits apparent. Per neurology continue anticoagulation, do not add anti-platelet therapy. Echocardiogram and bilateral carotid dopplers unremarkable. LDL 115.  Parkinson's disease, with associated severe gait impairment. H/o deep brain stimulator placement. On Sinemet, entacapone.  LUE/LLE weakness at baseline, complication of deep brain stimulator.  Chronic thrombocytopenia of unclear significance. Stable. Follow-up as an outpatient.  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  Consultants:  Neurology  ST: dysphagia 3, thin liquid  Physical therapy: No follow-up.  Procedures:  2-D echocardiogram Study Conclusions  - Procedure narrative: Transthoracic echocardiography. Image quality was suboptimal, with poor right-sided chamber and valvular visualization. - Left ventricle: The cavity size was normal. There was mild concentric hypertrophy. Systolic function was normal. The estimated ejection fraction was in the range of 60% to 65%. Wall motion was normal; there were no regional wall motion abnormalities.  Findings consistent with left ventricular diastolic dysfunction. - Aortic valve: There was mild regurgitation. - Mitral valve: Mildly calcified annulus. Mildly thickened  leaflets . There was mild regurgitation. - Atrial septum: Poorly visualized.  EEG IMPRESSION: This recording is abnormal showing a moderately severe global generalized slowing indicated a moderate to severe generalized encephalopathy. However, there is no epileptiform activity observed.  Antibiotics:  Ceftriaxone 2/18 >> 2/21  Ceftin 2/22 >> 2/24  Azithromycin 2/18 >> 2/22 PM  Vancomycin 2/18 >> 2/22  Levaquin 2/17 >> 2/19  Discharge Instructions  Discharge Instructions    Care order/instruction    Complete by:  As directed   Give azithromycin prior to discharge     Diet - low sodium heart healthy    Complete by:  As directed      Discharge instructions    Complete by:  As directed   Call your physician or seek immediate medical attention for fever, shortness of breath, new weakness or worsening of condition.     Increase activity slowly    Complete by:  As directed           Current Discharge Medication List    START taking these medications   Details  cefUROXime (CEFTIN) 500 MG tablet Take 1 tablet (500 mg total) by mouth 2 (two) times daily with a meal. Qty: 5 tablet, Refills: 0      CONTINUE these medications which have NOT CHANGED   Details  carbidopa-levodopa (SINEMET IR) 25-100 MG per tablet Take 2 tablets by mouth 4 (four) times daily.    entacapone (COMTAN) 200 MG tablet Take 200 mg by mouth 4 (four) times daily.    rivaroxaban (XARELTO) 20 MG TABS tablet Take 20 mg by mouth daily with supper.       No Known Allergies  The results of significant diagnostics from this hospitalization (including imaging, microbiology, ancillary and laboratory) are listed below for reference.    Significant Diagnostic Studies: Ct Head Wo Contrast  05/19/2014   CLINICAL DATA:  Altered mental  status, history of deep brain stimulator for Parkinson's disease  EXAM: CT HEAD WITHOUT CONTRAST  TECHNIQUE: Contiguous axial images were obtained from the base of the skull through the vertex without intravenous contrast.  COMPARISON:  02/18/2005  FINDINGS: There is no evidence of mass effect, midline shift or extra-axial fluid collections. There is no evidence of a space-occupying lesion or intracranial hemorrhage. Mild low attenuation in the right medial temporal lobe temporal lobe concerning for a subacute infarct. There are bilateral deep brain stimulator wires terminating in the thalamus. Mild bilateral periventricular white matter low attenuation likely secondary to microangiopathy.  The ventricles and sulci are appropriate for the patient's age. The basal cisterns are patent.  Visualized portions of the orbits are unremarkable. Mild bilateral maxillary sinus mucosal thickening. Mastoid sinuses are clear.  The osseous structures are unremarkable.  IMPRESSION: 1. Mild low attenuation in the right medial temporal lobe temporal lobe concerning for a subacute infarct.   Electronically Signed   By: Elige KoHetal  Patel   On: 05/19/2014 14:26   [place] Carotid Bilateral  05/20/2014   CLINICAL DATA:  Left-sided weakness for 3 days.  Encephalopathy.  EXAM: BILATERAL CAROTID DUPLEX ULTRASOUND  TECHNIQUE: Wallace CullensGray scale imaging, color Doppler and duplex ultrasound were performed of bilateral carotid and vertebral arteries in the neck.  Note that the study is somewhat less than optimal due to patient's inability to suspend movement for prolonged periods of time  COMPARISON:  None.  FINDINGS: Criteria: Quantification of carotid stenosis is based on velocity parameters that correlate the residual internal carotid diameter with NASCET-based stenosis levels, using the  diameter of the distal internal carotid lumen as the denominator for stenosis measurement.  The following peak systolic velocity measurements were obtained:  RIGHT  ICA:   77 cm/sec  CCA:  80 cm/sec  SYSTOLIC ICA/CCA RATIO:  1.0  DIASTOLIC ICA/CCA RATIO:  1.2  ECA:  76 cm/sec  LEFT  ICA:  49 cm/sec  CCA:  64 cm/sec  SYSTOLIC ICA/CCA RATIO:  0.8  DIASTOLIC ICA/CCA RATIO:  2.3  ECA:  64 cm/sec  RIGHT CAROTID ARTERY: The common and internal carotid artery are tortuous. There is mild generalized intimal thickening. There is plaque in the bifurcation and proximal internal carotid artery causing approximately 25% diameter stenosis. No stenosis approaching 50% diameter is seen.  RIGHT VERTEBRAL ARTERY:  Flow is antegrade.  LEFT CAROTID ARTERY: The common internal carotid artery are tortuous. There is mild generalized intimal thickening. There is fairly smooth plaque in the bifurcation and proximal internal carotid artery causing approximately 25% diameter stenosis. No stenosis approaching 50% seen.  LEFT VERTEBRAL ARTERY:  Flow is antegrade.  IMPRESSION: Mild atherosclerotic change bilaterally. No hemodynamically significant obstruction seen on either side. Flow in both vertebral arteries is in the anatomic direction. The common and internal carotid arteries bilaterally are tortuous.   Electronically Signed   By: Bretta Bang III M.D.   On: 05/20/2014 15:28   Dg Chest Portable 1 View  05/19/2014   CLINICAL DATA:  Cough and congestion ; lethargy  EXAM: PORTABLE CHEST - 1 VIEW  COMPARISON:  December 25, 2006  FINDINGS: There is left base infiltrate. Lungs elsewhere clear. Heart is upper normal in size with pulmonary vascularity within normal limits. No adenopathy. No bone lesions.  IMPRESSION: Focal infiltrate left base. Elsewhere lungs clear. Heart upper normal in size.   Electronically Signed   By: Bretta Bang III M.D.   On: 05/19/2014 13:03    Microbiology: Recent Results (from the past 240 hour(s))  Urine culture     Status: None   Collection Time: 05/19/14 12:30 PM  Result Value Ref Range Status   Specimen Description URINE, CATHETERIZED  Final   Special Requests  NONE  Final   Colony Count   Final    >=100,000 COLONIES/ML Performed at [facility]    Culture   Final    Multiple bacterial morphotypes present, none predominant. Suggest appropriate recollection if clinically indicated. Performed at [facility]    Report Status 05/20/2014 FINAL  Final  Culture, blood (routine x 2)     Status: None   Collection Time: 05/19/14  2:18 PM  Result Value Ref Range Status   Specimen Description BLOOD RIGHT ARM DRAWN BY RN  Final   Special Requests BOTTLES DRAWN AEROBIC ONLY 5CC  Final   Culture   Final    STAPHYLOCOCCUS SPECIES (COAGULASE NEGATIVE) Note: THE SIGNIFICANCE OF ISOLATING THIS ORGANISM FROM A SINGLE VENIPUNCTURE CANNOT BE PREDICTED WITHOUT FURTHER CLINICAL AND CULTURE CORRELATION. SUSCEPTIBILITIES AVAILABLE ONLY ON REQUEST. Note: Gram Stain Report Called to,Read Back By and Verified With: STONE A 05/20/14 1910 BY Ginette Pitman Performed at [facility] Performed at [facility]    Report Status 05/22/2014 FINAL  Final  Culture, blood (routine x 2)     Status: None   Collection Time: 05/19/14  2:25 PM  Result Value Ref Range Status   Specimen Description BLOOD LEFT ARM  Final   Special Requests   Final    BOTTLES DRAWN AEROBIC AND ANAEROBIC AEB=9CC ANA=6CC   Culture NO  GROWTH 5 DAYS  Final   Report Status 05/24/2014 FINAL  Final  MRSA PCR Screening     Status: None   Collection Time: 05/19/14  6:54 PM  Result Value Ref Range Status   MRSA by PCR NEGATIVE NEGATIVE Final    Comment:        The GeneXpert MRSA Assay (FDA approved for NASAL specimens only), is one component of a comprehensive MRSA colonization surveillance program. It is not intended to diagnose MRSA infection nor to guide or monitor treatment for MRSA infections.   Gram stain     Status: None   Collection Time: 05/21/14 12:07 AM  Result Value Ref Range Status   Specimen Description TRACHEAL SITE  Final   Special Requests NONE  Final    Gram Stain   Final    ABUNDANT YEAST ABUNDANT GRAM POSITIVE COCCI ABUNDANT GRAM POSITIVE RODS RARE GRAM NEGATIVE RODS RARE GRAM NEGATIVE COCCI    Report Status 05/21/2014 FINAL  Final     Labs: Basic Metabolic Panel:  Recent Labs Lab 05/19/14 1123 05/20/14 0439 05/22/14 0454  NA 138 136 139  K 3.9 3.7 3.7  CL 104 105 108  CO2 28 26 24   GLUCOSE 95 85 110*  BUN 20 20 18   CREATININE 1.18 1.01 1.01  CALCIUM 9.9 9.2 9.4   Liver Function Tests:  Recent Labs Lab 05/20/14 0439  AST 20  ALT 15  ALKPHOS 46  BILITOT 1.8*  PROT 6.2  ALBUMIN 3.5   CBC:  Recent Labs Lab 05/19/14 1123 05/20/14 0439 05/22/14 0454  WBC 7.7 6.3 5.9  HGB 16.5 15.6 14.6  HCT 47.0 44.3 41.8  MCV 93.6 92.5 92.3  PLT 141* 134* 134*   CBG:  Recent Labs Lab 05/19/14 1232  GLUCAP 95    Principal Problem:   Sepsis Active Problems:   CAP (community acquired pneumonia)   Acute encephalopathy   Generalized weakness   Urinary tract infection   Parkinsonism   Stroke   Time coordinating discharge: 35 minutes  Signed:  Brendia Sacks, MD [facility] 05/24/2014, 1:15 PM

## 2014-05-25 NOTE — Progress Notes (Signed)
UR chart review completed.  

## 2016-05-09 IMAGING — CR DG CHEST 1V PORT
1 series · 1 of 1 positions shown · non-contrast
Comparison: December 25, 2006

CLINICAL DATA: Cough and congestion ; lethargy

EXAM:
PORTABLE CHEST - 1 VIEW

[portable]
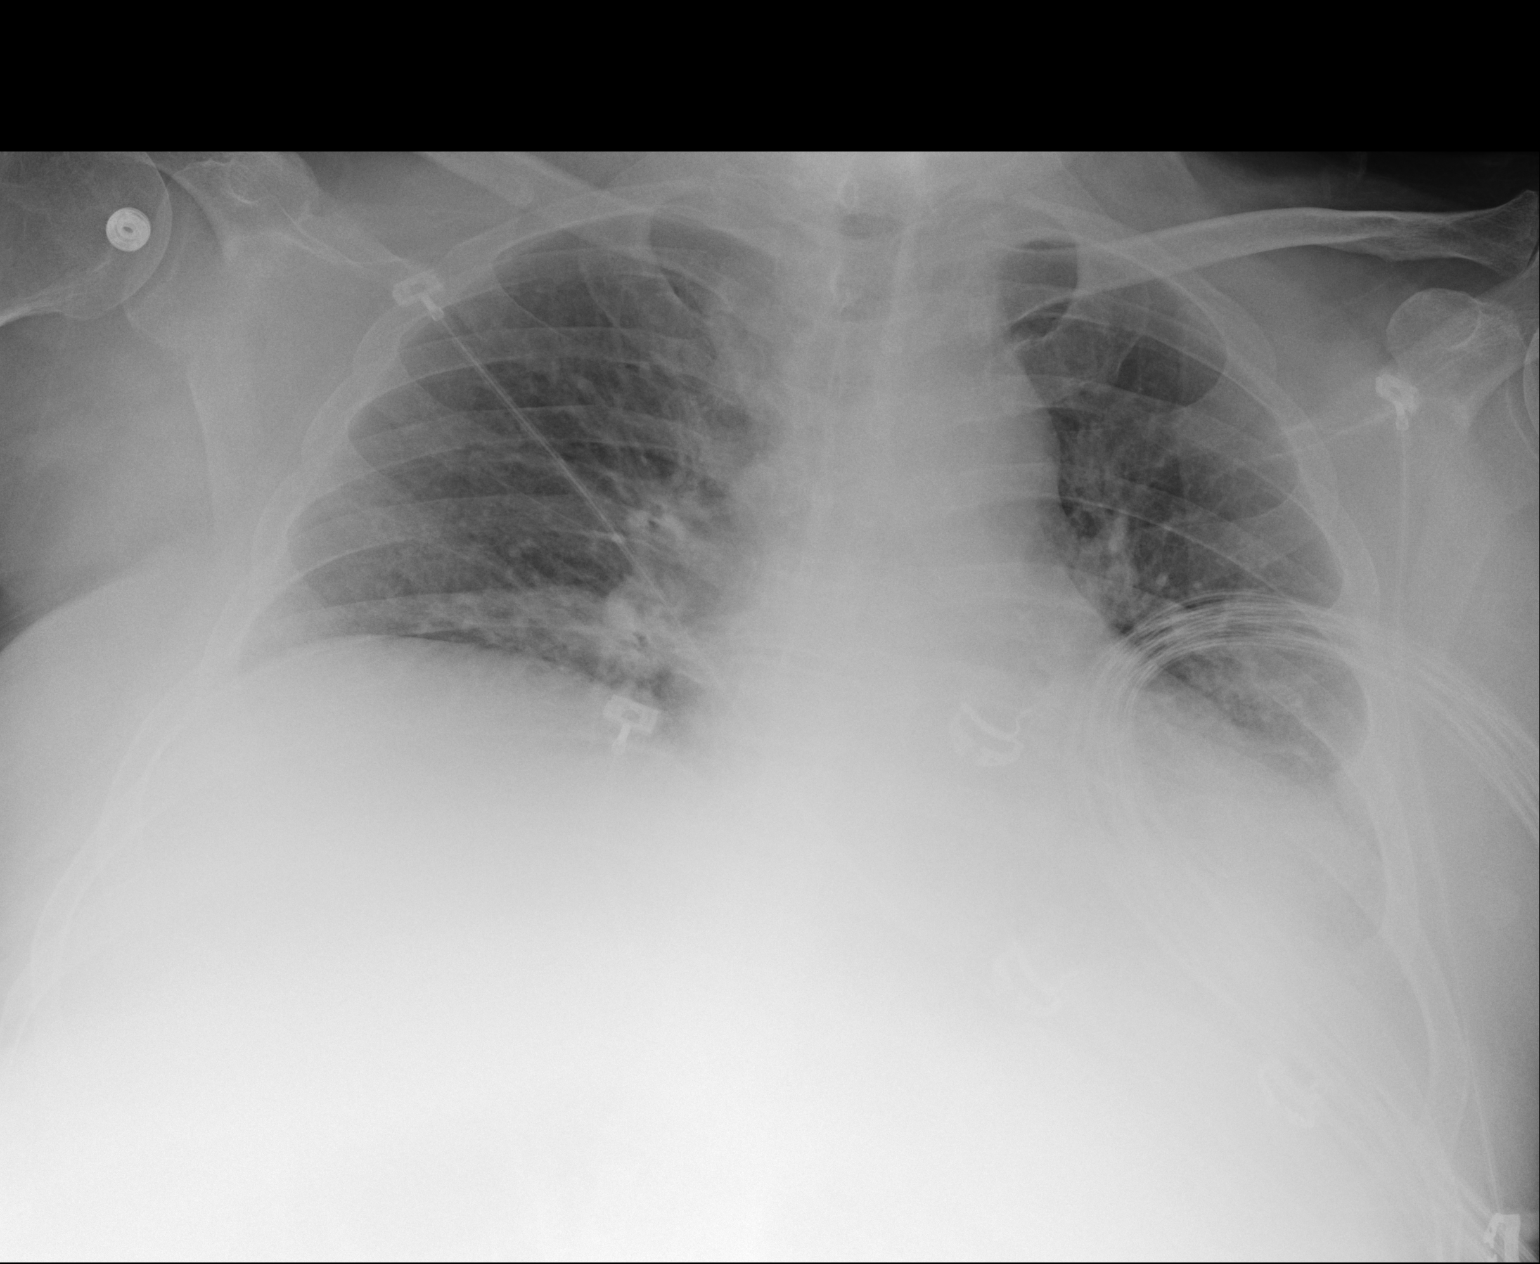

[1 of 1 positions shown; findings below may reference images not displayed]

FINDINGS: There is left base infiltrate. Lungs elsewhere clear. Heart is upper
normal in size with pulmonary vascularity within normal limits. No
adenopathy. No bone lesions.
IMPRESSION: Focal infiltrate left base. Elsewhere lungs clear. Heart upper
normal in size.

## 2016-05-09 IMAGING — CT CT HEAD W/O CM
1 of 2 series · 13 of 30 positions shown, 17 images · non-contrast
Comparison: 02/18/2005

CLINICAL DATA: Altered mental status, history of deep brain
stimulator for Parkinson's disease

EXAM:
CT HEAD WITHOUT CONTRAST
TECHNIQUE: Contiguous axial images were obtained from the base of the skull
through the vertex without intravenous contrast.

[Series 2: headseq 4.8 h37s · axial · 0.46mm/px · z∈[+1207,+1349]mm · 13 of 35 slices shown, 17 images]
[im 3/35  brain]
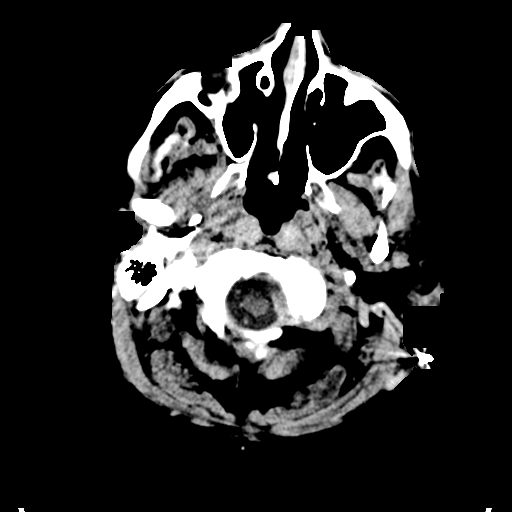
[im 3/35  bone]
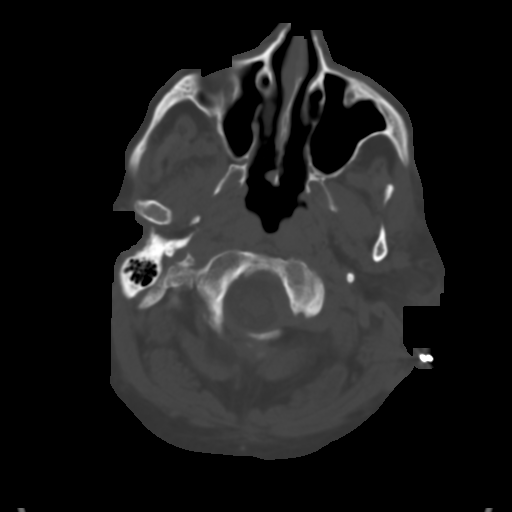
[im 5/35  brain]
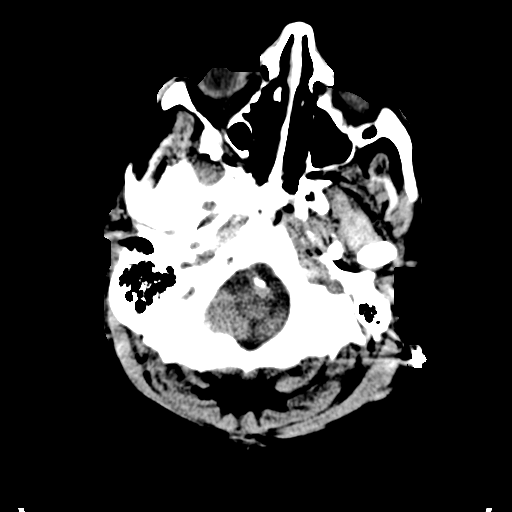
[im 8/35  brain]
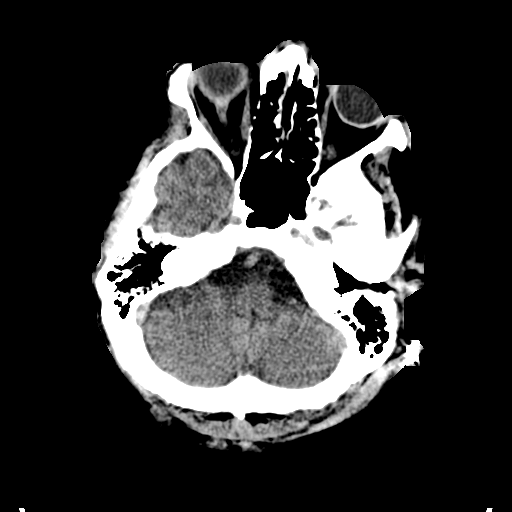
[im 10/35  brain]
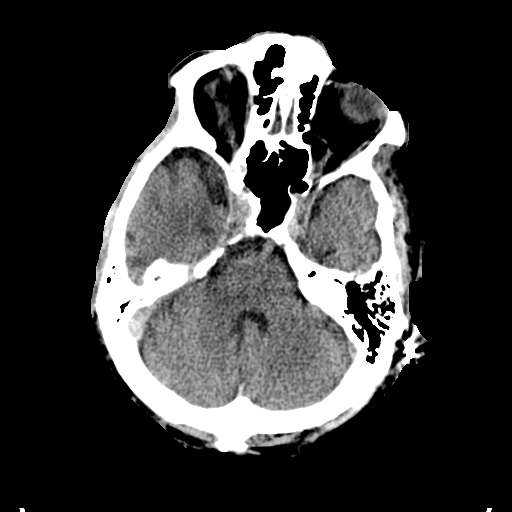
[im 13/35  brain]
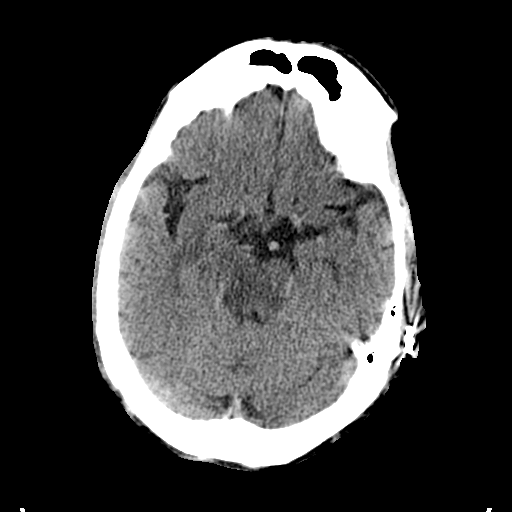
[im 13/35  bone]
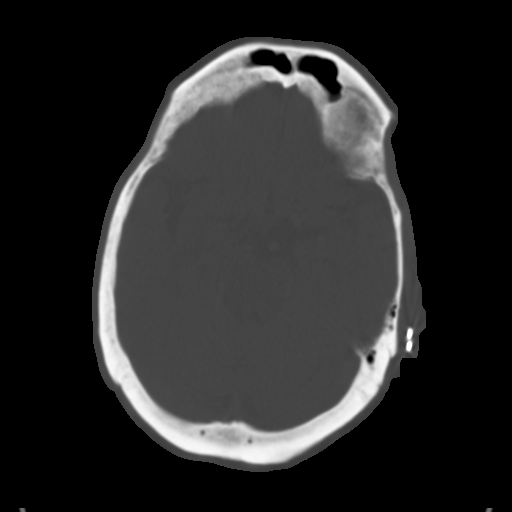
[im 15/35  brain]
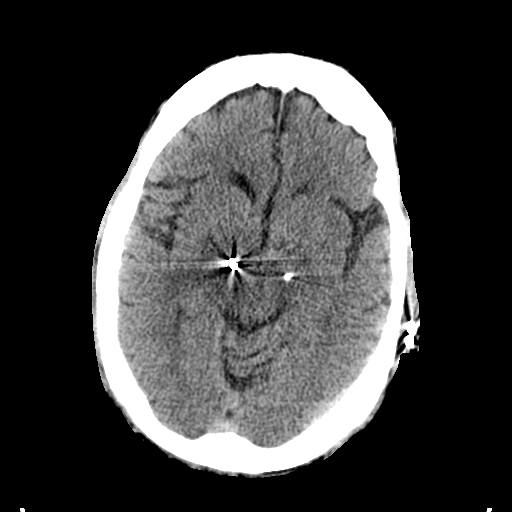
[im 18/35  brain]
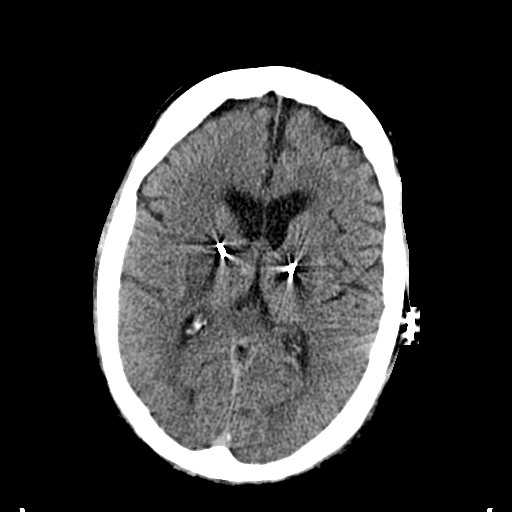
[im 20/35  brain]
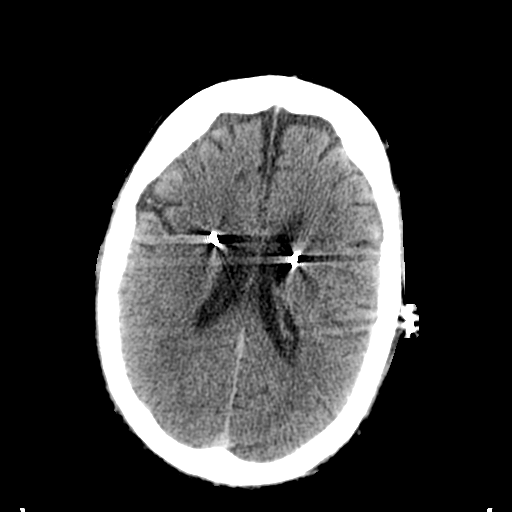
[im 22/35  brain]
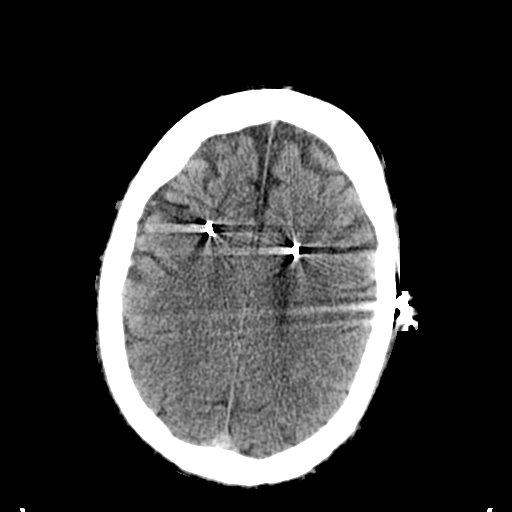
[im 22/35  bone]
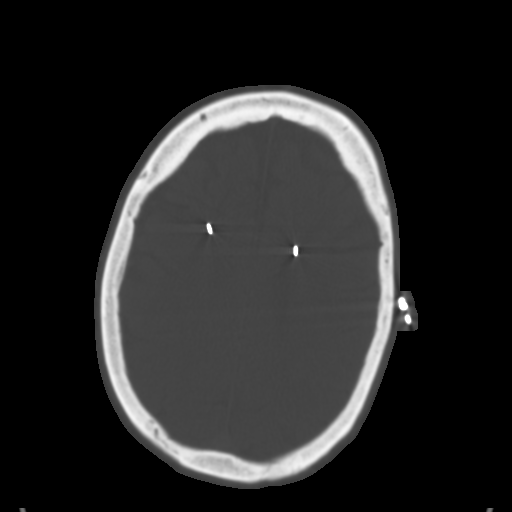
[im 25/35  brain]
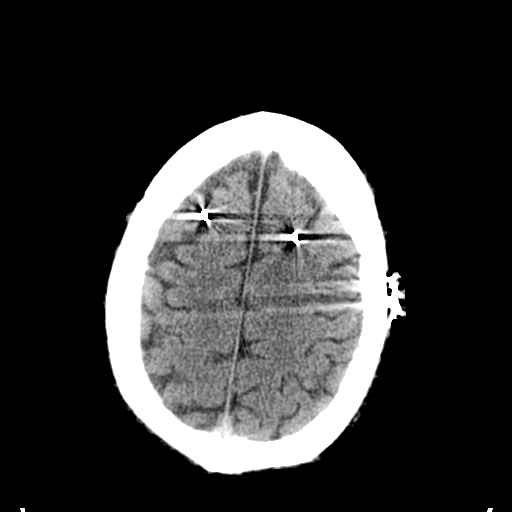
[im 27/35  brain]
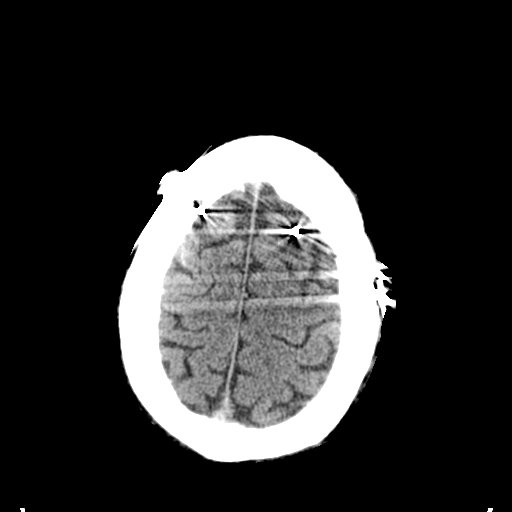
[im 30/35  brain]
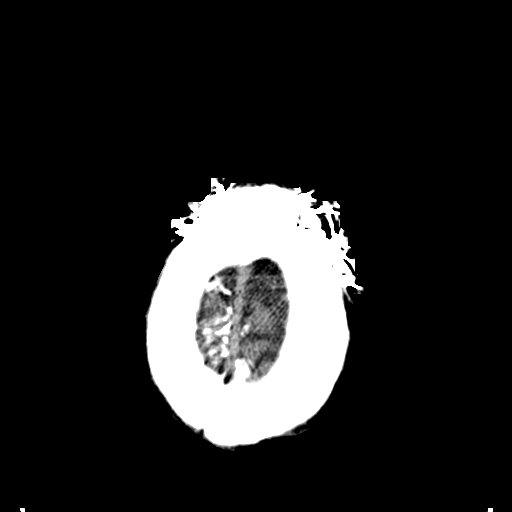
[im 32/35  brain]
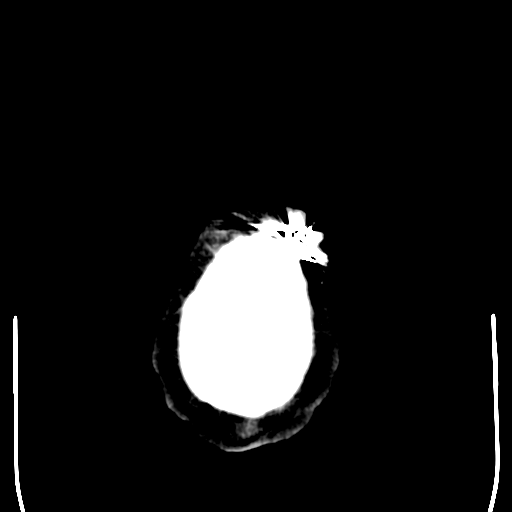
[im 32/35  bone]
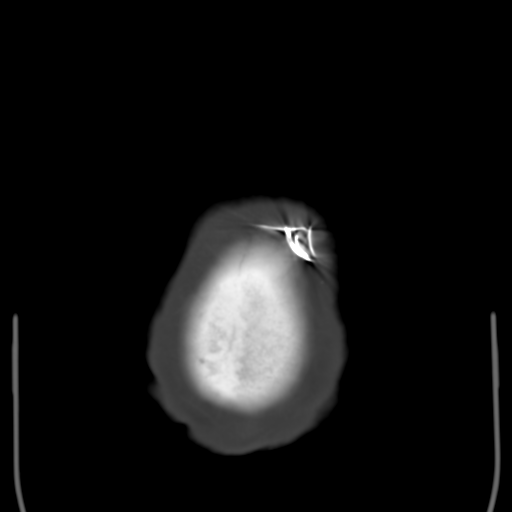

[13 of 30 positions shown; findings below may reference images not displayed]

FINDINGS: There is no evidence of mass effect, midline shift or extra-axial
fluid collections. There is no evidence of a space-occupying lesion
or intracranial hemorrhage. Mild low attenuation in the right medial
temporal lobe temporal lobe concerning for a subacute infarct. There
are bilateral deep brain stimulator wires terminating in the
thalamus. Mild bilateral periventricular white matter low
attenuation likely secondary to microangiopathy.

The ventricles and sulci are appropriate for the patient's age. The
basal cisterns are patent.

Visualized portions of the orbits are unremarkable. Mild bilateral
maxillary sinus mucosal thickening. Mastoid sinuses are clear.

The osseous structures are unremarkable.
IMPRESSION: 1. Mild low attenuation in the right medial temporal lobe temporal
lobe concerning for a subacute infarct.

## 2016-07-01 DEATH — deceased
